# Patient Record
Sex: Male | Born: 1960 | Race: White | Hispanic: No | Marital: Married | Smoking: Former smoker
Health system: Southern US, Community
[De-identification: ages and names within clinical notes are randomized; demographics above are authoritative.]

## PROBLEM LIST (undated history)

## (undated) DIAGNOSIS — Z8719 Personal history of other diseases of the digestive system: Secondary | ICD-10-CM

## (undated) DIAGNOSIS — K219 Gastro-esophageal reflux disease without esophagitis: Secondary | ICD-10-CM

## (undated) DIAGNOSIS — K420 Umbilical hernia with obstruction, without gangrene: Secondary | ICD-10-CM

## (undated) DIAGNOSIS — Z87442 Personal history of urinary calculi: Secondary | ICD-10-CM

## (undated) DIAGNOSIS — I700 Atherosclerosis of aorta: Secondary | ICD-10-CM

## (undated) DIAGNOSIS — M1990 Unspecified osteoarthritis, unspecified site: Secondary | ICD-10-CM

## (undated) DIAGNOSIS — K5790 Diverticulosis of intestine, part unspecified, without perforation or abscess without bleeding: Secondary | ICD-10-CM

## (undated) HISTORY — PX: COLONOSCOPY W/ POLYPECTOMY: SHX1380

## (undated) HISTORY — PX: EXTRACORPOREAL SHOCK WAVE LITHOTRIPSY: SHX1557

## (undated) HISTORY — PX: HERNIA REPAIR: SHX51

---

## 1993-09-20 HISTORY — PX: THYROIDECTOMY, PARTIAL: SHX18

## 2004-08-19 ENCOUNTER — Emergency Department: Payer: Self-pay | Admitting: Emergency Medicine

## 2004-10-27 ENCOUNTER — Ambulatory Visit: Payer: Self-pay | Admitting: Specialist

## 2005-11-13 IMAGING — CT CT ABDOMEN AND PELVIS WITHOUT AND WITH CONTRAST
2 of 7 series · 14 of 32 positions shown, 19 images · non-contrast
Comparison: none

REASON FOR EXAM: flank pain
COMMENTS:

[Series 3: without inspace · axial · non-contrast · 0.69mm/px · z∈[-527,-185]mm · 7 of 457 slices shown, 12 images]
[im 58/457  soft-tissue]
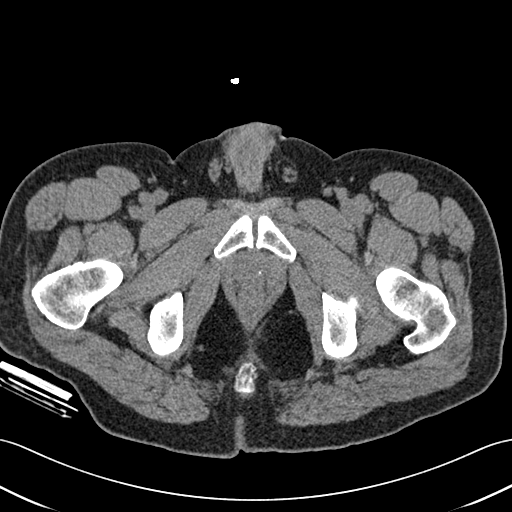
[im 58/457  bone]
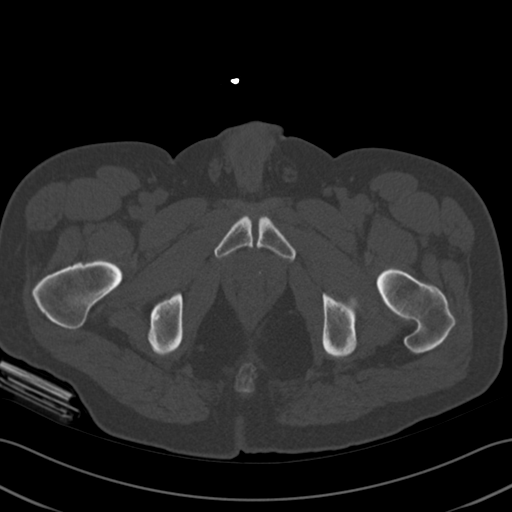
[im 115/457  soft-tissue]
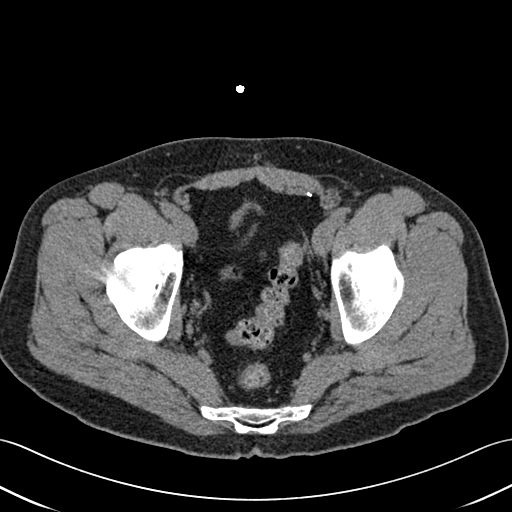
[im 172/457  soft-tissue]
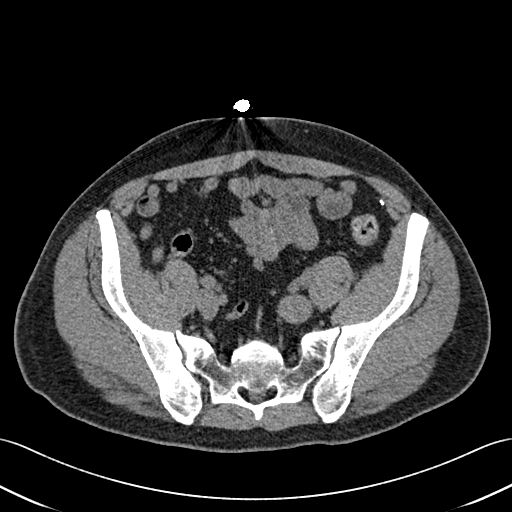
[im 229/457  soft-tissue]
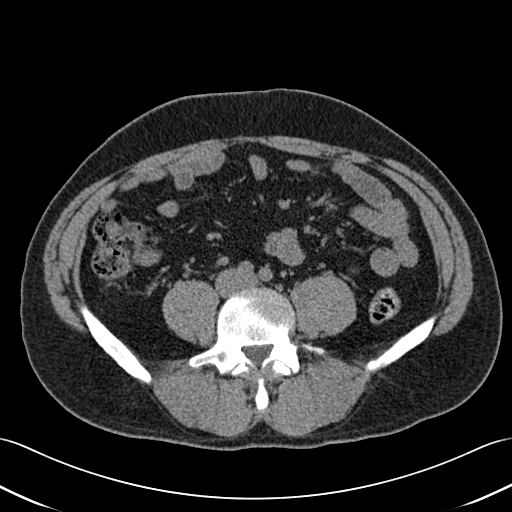
[im 229/457  lung]
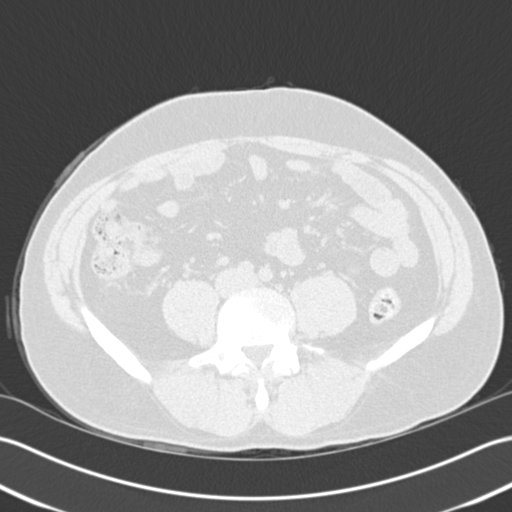
[im 286/457  soft-tissue]
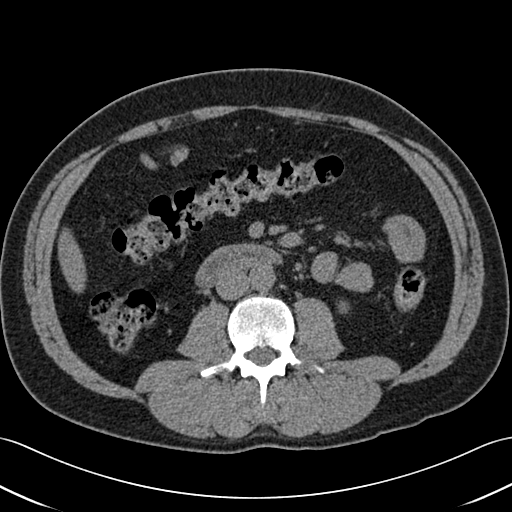
[im 286/457  lung]
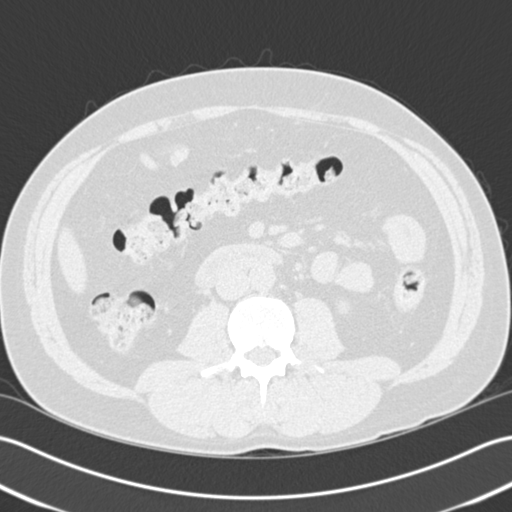
[im 343/457  soft-tissue]
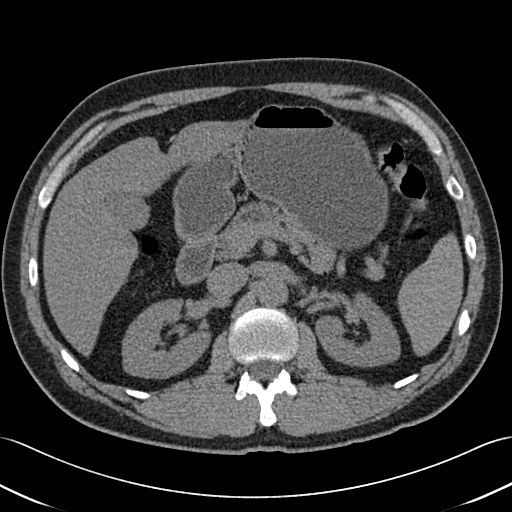
[im 343/457  lung]
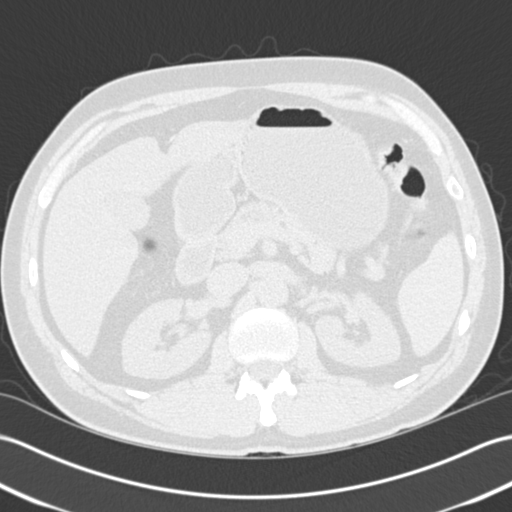
[im 400/457  soft-tissue]
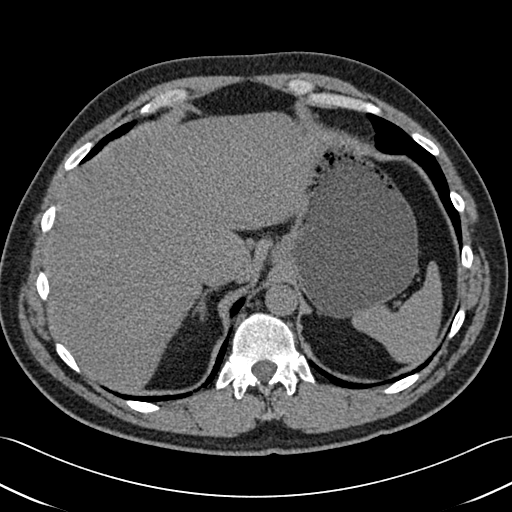
[im 400/457  lung]
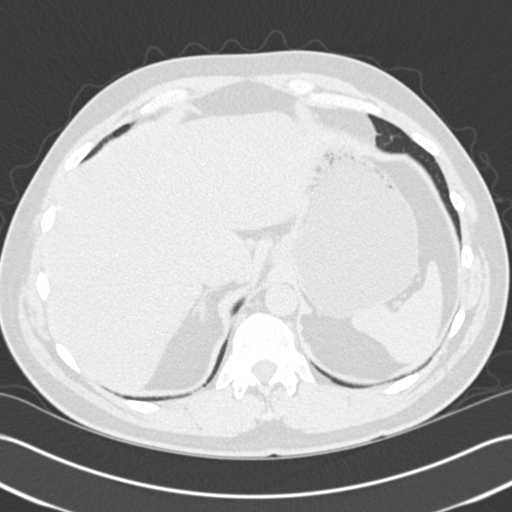

[Series 5: with inspace · axial · 0.69mm/px · z∈[-527,-185]mm · 7 of 457 slices shown]
[im 58/457  soft-tissue]
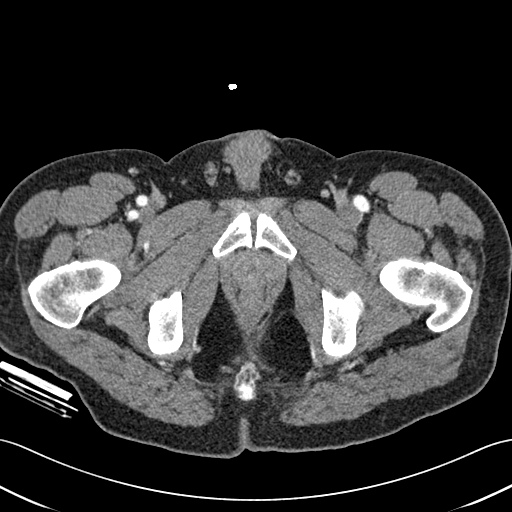
[im 115/457  soft-tissue]
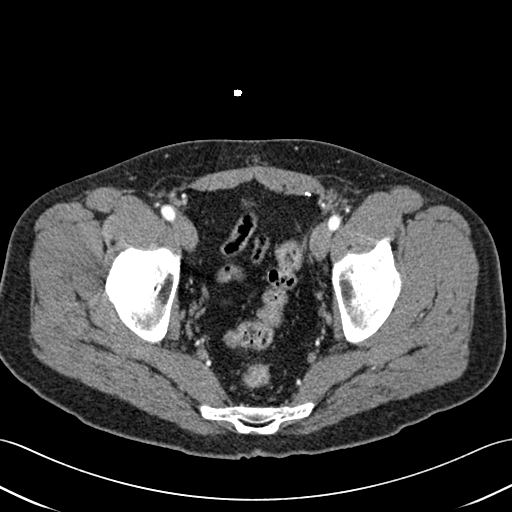
[im 172/457  soft-tissue]
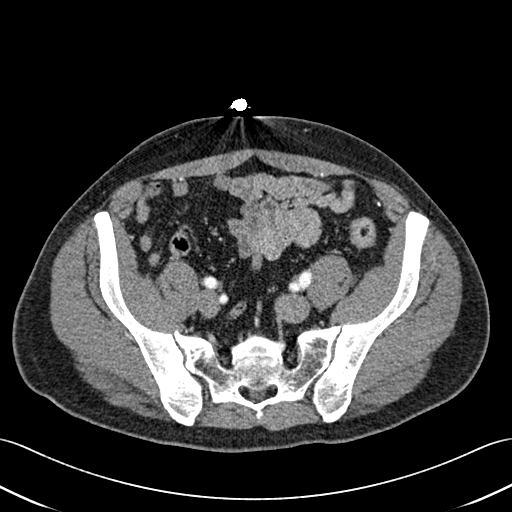
[im 229/457  soft-tissue]
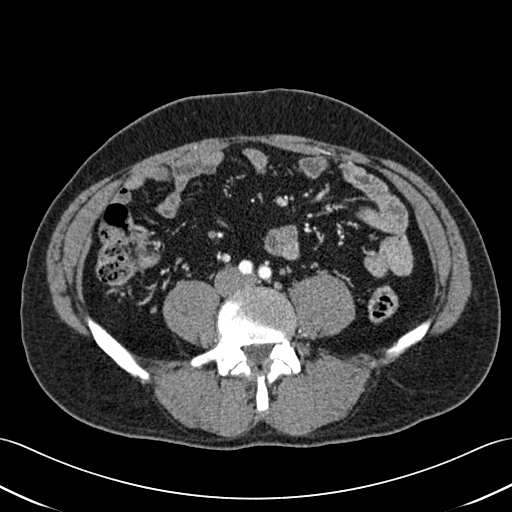
[im 286/457  soft-tissue]
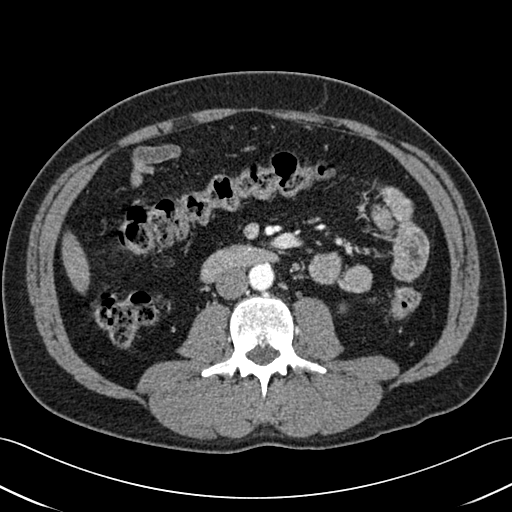
[im 343/457  soft-tissue]
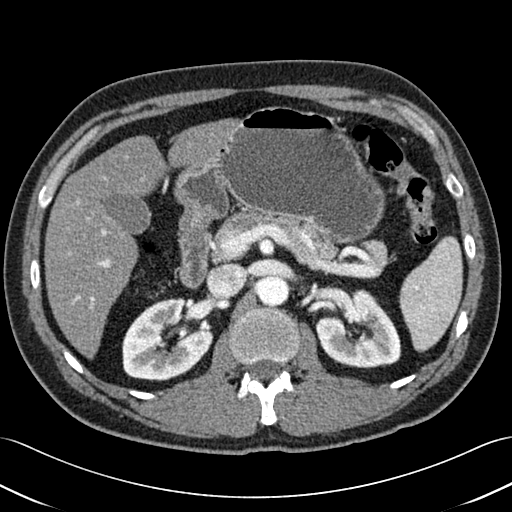
[im 400/457  soft-tissue]
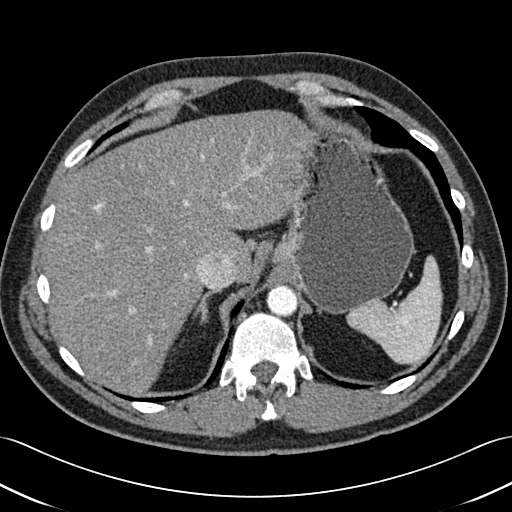

[14 of 32 positions shown; findings below may reference images not displayed]

PROCEDURE:     CT  - CT ABDOMEN / PELVIS  W/WO  - October 27, 2004  [DATE]

RESULT:     8.0 mm helical cuts were performed through the abdomen and
pelvis before and after administration of 100 cc's of Asovue-HFK contrast.

Comparison is made to a previous examination of 08/19/2004 which showed a
stone in the distal LEFT ureter.

Today's examination shows the lung bases to be clear. The solid organs,
aside from the kidneys, are unremarkable. The RIGHT kidney shows small,
to 2.0 mm calcifications over the upper and lower poles but these
calcifications are located in the corticomedullary junction and are not
causing obvious obstruction. Neither kidney shows evidence of hydronephrosis
or hydroureter. No perinephric stranding is identified. After contrast was
administered, there is normal enhancement of the solid organs of the
abdomen. There is some diffuse fatty infiltration of the liver. Again, there
is no evidence of obstructive uropathy. No free intraperitoneal fluid, air
or adenopathy is noted. Cuts through the pelvis show no evidence of
diverticular disease. The bladder distends normally without evidence of
filling defect or wall thickening and no distal ureteral calculus is
identified on today's study. The bony structures are normal.
IMPRESSION: 1.     Small, 1.0 to 2.0 mm calcifications are located in the
corticomedullary junction of the upper and lower pole of the RIGHT kidney
but they are not causing obstruction. There is no obstructive uropathy
identified today. No evidence of perinephric stranding. The previous study
of 08/19/2004 showed a small calcification in the distal LEFT ureter which
has resolved.
2.     No suspicious solid organ abnormality, free intraperitoneal fluid,
air or adenopathy.

## 2016-08-20 DIAGNOSIS — M1990 Unspecified osteoarthritis, unspecified site: Secondary | ICD-10-CM

## 2016-08-20 HISTORY — DX: Unspecified osteoarthritis, unspecified site: M19.90

## 2016-11-08 ENCOUNTER — Other Ambulatory Visit: Payer: Self-pay | Admitting: Otolaryngology

## 2016-11-08 DIAGNOSIS — E890 Postprocedural hypothyroidism: Secondary | ICD-10-CM

## 2016-11-15 ENCOUNTER — Ambulatory Visit
Admission: RE | Admit: 2016-11-15 | Discharge: 2016-11-15 | Disposition: A | Discharge status: Home / Self Care | Payer: BLUE CROSS/BLUE SHIELD | Source: Ambulatory Visit | Attending: Otolaryngology | Admitting: Otolaryngology

## 2016-11-15 DIAGNOSIS — E890 Postprocedural hypothyroidism: Secondary | ICD-10-CM | POA: Insufficient documentation

## 2016-11-15 DIAGNOSIS — E042 Nontoxic multinodular goiter: Secondary | ICD-10-CM | POA: Insufficient documentation

## 2017-05-03 ENCOUNTER — Encounter
Admission: RE | Admit: 2017-05-03 | Discharge: 2017-05-03 | Disposition: A | Discharge status: Home / Self Care | Payer: BLUE CROSS/BLUE SHIELD | Source: Ambulatory Visit | Attending: Otolaryngology | Admitting: Otolaryngology

## 2017-05-03 DIAGNOSIS — Z01812 Encounter for preprocedural laboratory examination: Secondary | ICD-10-CM | POA: Diagnosis present

## 2017-05-03 HISTORY — DX: Gastro-esophageal reflux disease without esophagitis: K21.9

## 2017-05-03 HISTORY — DX: Personal history of urinary calculi: Z87.442

## 2017-05-03 HISTORY — DX: Unspecified osteoarthritis, unspecified site: M19.90

## 2017-05-03 LAB — CBC
HEMATOCRIT: 43.4 % (ref 40.0–52.0)
HEMOGLOBIN: 14.9 g/dL (ref 13.0–18.0)
MCH: 33.1 pg (ref 26.0–34.0)
MCHC: 34.3 g/dL (ref 32.0–36.0)
MCV: 96.5 fL (ref 80.0–100.0)
Platelets: 213 10*3/uL (ref 150–440)
RBC: 4.49 MIL/uL (ref 4.40–5.90)
RDW: 12.9 % (ref 11.5–14.5)
WBC: 6 10*3/uL (ref 3.8–10.6)

## 2017-05-03 LAB — BASIC METABOLIC PANEL
ANION GAP: 7 (ref 5–15)
BUN: 17 mg/dL (ref 6–20)
CHLORIDE: 107 mmol/L (ref 101–111)
CO2: 27 mmol/L (ref 22–32)
CREATININE: 0.86 mg/dL (ref 0.61–1.24)
Calcium: 9.6 mg/dL (ref 8.9–10.3)
GFR calc Af Amer: >60 mL/min (ref >60)
GFR calc non Af Amer: >60 mL/min (ref >60)
Glucose, Bld: 126 mg/dL — ABNORMAL HIGH (ref 65–99)
Potassium: 3.9 mmol/L (ref 3.5–5.1)
Sodium: 141 mmol/L (ref 135–145)

## 2017-05-03 NOTE — Patient Instructions (Signed)
  Your procedure is scheduled WU:[id number]:Monday May 09, 2017. Report to Same Day Surgery. To find out your arrival time please call [phone] between 1PM - 3PM on Friday May 06, 2017 .  Remember: Instructions that are not followed completely may result in serious medical risk, up to and including death, or upon the discretion of your surgeon and anesthesiologist your surgery may need to be rescheduled.    _x___ 1. Do not eat food or drink liquids after midnight. No gum chewing or hard candies.     ____ 2. No Alcohol for 24 hours before or after surgery.   ____ 3. Bring all medications with you on the day of surgery if instructed.    __x__ 4. Notify your doctor if there is any change in your medical condition     (cold, fever, infections).    _____ 5. No smoking 24 hours prior to surgery.     Do not wear jewelry, make-up, hairpins, clips or nail polish.  Do not wear lotions, powders, or perfumes.   Do not shave 48 hours prior to surgery. Men may shave face and neck.  Do not bring valuables to the hospital.    [facility] any belongings or valuables.               Contacts, dentures or bridgework may not be worn into surgery.  Leave your suitcase in the car. After surgery it may be brought to your room.  For patients admitted to the hospital, discharge time is determined by your treatment team.   Patients discharged the day of surgery will not be allowed to drive home.    Please read over the following fact sheets that you were given:   [facility] Preparing for [facility]  _x___ Take these medicines the morning of surgery with A SIP OF WATER:    1. levothyroxine (SYNTHROID, LEVOTHROID)  2. omeprazole (PRILOSEC) take 1 dose at bedtime the night prior to surgery and the morning of surgery.   ____ Fleet Enema (as directed)   __x__ Use CHG Soap as directed on instruction sheet  ____ Use inhalers on the day of surgery and bring to hospital day of  surgery  ____ Stop metformin 2 days prior to surgery    ____ Take 1/2 of usual insulin dose the night before surgery and none on the morning of surgery.   ____ Stop Coumadin/Plavix/aspirin on does not apply.  _x___ Stop Anti-inflammatories such as Advil, Aleve, Ibuprofen, Mobic, Motrin, Naproxen, Naprosyn, Goodies powders or  aspirin products. OK to take Tylenol.   ____ Stop supplements until after surgery.    ____ Bring C-Pap to the hospital.

## 2017-05-09 ENCOUNTER — Observation Stay
Admission: RE | Admit: 2017-05-09 | Discharge: 2017-05-10 | Disposition: A | Discharge status: Home / Self Care | Payer: BLUE CROSS/BLUE SHIELD | Source: Ambulatory Visit | Attending: Otolaryngology | Admitting: Otolaryngology

## 2017-05-09 ENCOUNTER — Encounter: Payer: Self-pay | Admitting: *Deleted

## 2017-05-09 ENCOUNTER — Ambulatory Visit: Payer: BLUE CROSS/BLUE SHIELD | Admitting: Anesthesiology

## 2017-05-09 ENCOUNTER — Encounter
Admission: RE | Disposition: A | Discharge status: Home / Self Care | Payer: Self-pay | Source: Ambulatory Visit | Attending: Otolaryngology

## 2017-05-09 ENCOUNTER — Other Ambulatory Visit: Payer: BLUE CROSS/BLUE SHIELD

## 2017-05-09 DIAGNOSIS — Z79899 Other long term (current) drug therapy: Secondary | ICD-10-CM | POA: Diagnosis not present

## 2017-05-09 DIAGNOSIS — Z85850 Personal history of malignant neoplasm of thyroid: Secondary | ICD-10-CM | POA: Insufficient documentation

## 2017-05-09 DIAGNOSIS — Z9889 Other specified postprocedural states: Secondary | ICD-10-CM

## 2017-05-09 DIAGNOSIS — E041 Nontoxic single thyroid nodule: Principal | ICD-10-CM | POA: Insufficient documentation

## 2017-05-09 DIAGNOSIS — E890 Postprocedural hypothyroidism: Secondary | ICD-10-CM

## 2017-05-09 HISTORY — PX: THYROIDECTOMY: SHX17

## 2017-05-09 SURGERY — THYROIDECTOMY
Anesthesia: General | Wound class: Clean

## 2017-05-09 MED ORDER — ACETAMINOPHEN 650 MG RE SUPP: 650.0000 mg | Freq: Four times a day (QID) | RECTAL | Status: DC | PRN

## 2017-05-09 MED ORDER — LIDOCAINE-EPINEPHRINE (PF) 1 %-1:200000 IJ SOLN
INTRAMUSCULAR | Status: AC
  Filled 2017-05-09: qty 30

## 2017-05-09 MED ORDER — HYDROCODONE-ACETAMINOPHEN 5-325 MG PO TABS
1.0000 | ORAL_TABLET | ORAL | Status: DC | PRN
  Administered 2017-05-09: 2 via ORAL
  Filled 2017-05-09: qty 2

## 2017-05-09 MED ORDER — ONDANSETRON HCL 4 MG/2ML IJ SOLN: 4.0000 mg | Freq: Once | INTRAMUSCULAR | Status: DC | PRN

## 2017-05-09 MED ORDER — DEXAMETHASONE SODIUM PHOSPHATE 10 MG/ML IJ SOLN
INTRAMUSCULAR | Status: DC | PRN
  Administered 2017-05-09: 10 mg via INTRAVENOUS

## 2017-05-09 MED ORDER — GLYCOPYRROLATE 0.2 MG/ML IJ SOLN
INTRAMUSCULAR | Status: AC
  Filled 2017-05-09: qty 1

## 2017-05-09 MED ORDER — LACTATED RINGERS IV SOLN
INTRAVENOUS | Status: DC
  Administered 2017-05-09: 06:00:00 via INTRAVENOUS

## 2017-05-09 MED ORDER — MAGNESIUM HYDROXIDE 400 MG/5ML PO SUSP
30.0000 mL | Freq: Every day | ORAL | Status: DC | PRN
  Filled 2017-05-09: qty 30

## 2017-05-09 MED ORDER — PROPOFOL 10 MG/ML IV BOLUS
INTRAVENOUS | Status: AC
  Filled 2017-05-09: qty 40

## 2017-05-09 MED ORDER — LIDOCAINE HCL (PF) 2 % IJ SOLN
INTRAMUSCULAR | Status: AC
  Filled 2017-05-09: qty 2

## 2017-05-09 MED ORDER — MIDAZOLAM HCL 2 MG/2ML IJ SOLN
INTRAMUSCULAR | Status: DC | PRN
  Administered 2017-05-09: 2 mg via INTRAVENOUS

## 2017-05-09 MED ORDER — PHENYLEPHRINE HCL 10 MG/ML IJ SOLN
INTRAMUSCULAR | Status: AC
  Filled 2017-05-09: qty 1

## 2017-05-09 MED ORDER — FENTANYL CITRATE (PF) 100 MCG/2ML IJ SOLN
INTRAMUSCULAR | Status: DC | PRN
  Administered 2017-05-09 (×2): 100 ug via INTRAVENOUS

## 2017-05-09 MED ORDER — PANTOPRAZOLE SODIUM 40 MG PO TBEC
40.0000 mg | DELAYED_RELEASE_TABLET | Freq: Every day | ORAL | Status: DC
  Administered 2017-05-09: 40 mg via ORAL
  Filled 2017-05-09: qty 1

## 2017-05-09 MED ORDER — MIDAZOLAM HCL 2 MG/2ML IJ SOLN
INTRAMUSCULAR | Status: AC
  Filled 2017-05-09: qty 2

## 2017-05-09 MED ORDER — ONDANSETRON 4 MG PO TBDP: 4.0000 mg | ORAL_TABLET | Freq: Four times a day (QID) | ORAL | Status: DC | PRN

## 2017-05-09 MED ORDER — DEXTROSE-NACL 5-0.45 % IV SOLN
INTRAVENOUS | Status: DC
  Administered 2017-05-09 (×2): via INTRAVENOUS

## 2017-05-09 MED ORDER — ONDANSETRON HCL 4 MG/2ML IJ SOLN
INTRAMUSCULAR | Status: DC | PRN
  Administered 2017-05-09: 4 mg via INTRAVENOUS

## 2017-05-09 MED ORDER — LIDOCAINE HCL (CARDIAC) 20 MG/ML IV SOLN
INTRAVENOUS | Status: DC | PRN
  Administered 2017-05-09: 100 mg via INTRAVENOUS

## 2017-05-09 MED ORDER — CALCIUM CARBONATE-VITAMIN D 500-200 MG-UNIT PO TABS
1.0000 | ORAL_TABLET | Freq: Every day | ORAL | Status: DC
  Administered 2017-05-09: 12:00:00 1 via ORAL
  Filled 2017-05-09: qty 1

## 2017-05-09 MED ORDER — EPHEDRINE SULFATE 50 MG/ML IJ SOLN
INTRAMUSCULAR | Status: DC | PRN
  Administered 2017-05-09: 10 mg via INTRAVENOUS

## 2017-05-09 MED ORDER — ACETAMINOPHEN 10 MG/ML IV SOLN
INTRAVENOUS | Status: AC
  Filled 2017-05-09: qty 100

## 2017-05-09 MED ORDER — BACITRACIN 500 UNIT/GM EX OINT
TOPICAL_OINTMENT | CUTANEOUS | Status: DC | PRN
  Administered 2017-05-09: 1 via TOPICAL

## 2017-05-09 MED ORDER — SUCCINYLCHOLINE CHLORIDE 20 MG/ML IJ SOLN
INTRAMUSCULAR | Status: DC | PRN
  Administered 2017-05-09: 100 mg via INTRAVENOUS

## 2017-05-09 MED ORDER — FENTANYL CITRATE (PF) 100 MCG/2ML IJ SOLN
INTRAMUSCULAR | Status: AC
  Filled 2017-05-09: qty 2

## 2017-05-09 MED ORDER — LIDOCAINE-EPINEPHRINE (PF) 1 %-1:200000 IJ SOLN
INTRAMUSCULAR | Status: DC | PRN
  Administered 2017-05-09: 6 mL

## 2017-05-09 MED ORDER — PROPOFOL 10 MG/ML IV BOLUS
INTRAVENOUS | Status: DC | PRN
  Administered 2017-05-09: 200 mg via INTRAVENOUS

## 2017-05-09 MED ORDER — SUCCINYLCHOLINE CHLORIDE 20 MG/ML IJ SOLN
INTRAMUSCULAR | Status: AC
  Filled 2017-05-09: qty 1

## 2017-05-09 MED ORDER — ACETAMINOPHEN 325 MG PO TABS
650.0000 mg | ORAL_TABLET | ORAL | Status: DC | PRN
  Administered 2017-05-09 – 2017-05-10 (×3): 650 mg via ORAL
  Filled 2017-05-09 (×3): qty 2

## 2017-05-09 MED ORDER — BISACODYL 5 MG PO TBEC: 5.0000 mg | DELAYED_RELEASE_TABLET | Freq: Every day | ORAL | Status: DC | PRN

## 2017-05-09 MED ORDER — MORPHINE SULFATE (PF) 4 MG/ML IV SOLN: 3.0000 mg | INTRAVENOUS | Status: DC | PRN

## 2017-05-09 MED ORDER — SODIUM CHLORIDE FLUSH 0.9 % IV SOLN
INTRAVENOUS | Status: AC
  Filled 2017-05-09: qty 10

## 2017-05-09 MED ORDER — FLEET ENEMA 7-19 GM/118ML RE ENEM: 1.0000 | ENEMA | Freq: Once | RECTAL | Status: DC | PRN

## 2017-05-09 MED ORDER — BACITRACIN ZINC 500 UNIT/GM EX OINT
TOPICAL_OINTMENT | CUTANEOUS | Status: AC
  Filled 2017-05-09: qty 28.35

## 2017-05-09 MED ORDER — FENTANYL CITRATE (PF) 100 MCG/2ML IJ SOLN: 25.0000 ug | INTRAMUSCULAR | Status: DC | PRN

## 2017-05-09 MED ORDER — ACETAMINOPHEN 10 MG/ML IV SOLN
INTRAVENOUS | Status: DC | PRN
  Administered 2017-05-09: 1000 mg via INTRAVENOUS

## 2017-05-09 MED ORDER — ONDANSETRON HCL 4 MG/2ML IJ SOLN
INTRAMUSCULAR | Status: AC
  Filled 2017-05-09: qty 2

## 2017-05-09 MED ORDER — ONDANSETRON HCL 4 MG/2ML IJ SOLN: 4.0000 mg | Freq: Four times a day (QID) | INTRAMUSCULAR | Status: DC | PRN

## 2017-05-09 MED ORDER — DEXAMETHASONE SODIUM PHOSPHATE 10 MG/ML IJ SOLN
INTRAMUSCULAR | Status: AC
  Filled 2017-05-09: qty 1

## 2017-05-09 MED ORDER — EPHEDRINE SULFATE 50 MG/ML IJ SOLN
INTRAMUSCULAR | Status: AC
  Filled 2017-05-09: qty 1

## 2017-05-09 SURGICAL SUPPLY — 39 items
BLADE SURG 15 STRL LF DISP TIS (BLADE) ×1 IMPLANT
BLADE SURG 15 STRL SS (BLADE) ×2
CANISTER SUCT 1200ML W/VALVE (MISCELLANEOUS) ×3 IMPLANT
CORD BIP STRL DISP 12FT (MISCELLANEOUS) ×3 IMPLANT
DRAIN TLS ROUND 10FR (DRAIN) IMPLANT
DRAPE MAG INST 16X20 L/F (DRAPES) ×3 IMPLANT
DRSG TEGADERM 2-3/8X2-3/4 SM (GAUZE/BANDAGES/DRESSINGS) ×3 IMPLANT
DRSG TEGADERM 4X4.75 (GAUZE/BANDAGES/DRESSINGS) ×3 IMPLANT
DRSG TELFA 3X8 NADH (GAUZE/BANDAGES/DRESSINGS) ×3 IMPLANT
ELECT LARYNGEAL 6/7 (MISCELLANEOUS) ×3
ELECT LARYNGEAL 8/9 (MISCELLANEOUS)
ELECT REM PT RETURN 9FT ADLT (ELECTROSURGICAL) ×3
ELECTRODE LARYNGEAL 6/7 (MISCELLANEOUS) ×1 IMPLANT
ELECTRODE LARYNGEAL 8/9 (MISCELLANEOUS) IMPLANT
ELECTRODE REM PT RTRN 9FT ADLT (ELECTROSURGICAL) ×1 IMPLANT
FORCEPS JEWEL BIP 4-3/4 STR (INSTRUMENTS) ×3 IMPLANT
GLOVE BIO SURGEON STRL SZ7.5 (GLOVE) ×6 IMPLANT
GLOVE BIOGEL PI IND STRL 6.5 (GLOVE) ×1 IMPLANT
GLOVE BIOGEL PI INDICATOR 6.5 (GLOVE) ×2
GLOVE PROTEXIS LATEX SZ 7.5 (GLOVE) ×6 IMPLANT
GOWN STRL REUS W/ TWL LRG LVL3 (GOWN DISPOSABLE) ×3 IMPLANT
GOWN STRL REUS W/TWL LRG LVL3 (GOWN DISPOSABLE) ×6
HEMOSTAT SURGICEL 2X3 (HEMOSTASIS) ×3 IMPLANT
HOOK STAY BLUNT/RETRACTOR 5M (MISCELLANEOUS) ×3 IMPLANT
LABEL OR SOLS (LABEL) ×3 IMPLANT
NS IRRIG 500ML POUR BTL (IV SOLUTION) ×3 IMPLANT
PACK HEAD/NECK (MISCELLANEOUS) ×3 IMPLANT
PROBE NEUROSIGN BIPOL (MISCELLANEOUS) ×1 IMPLANT
PROBE NEUROSIGN BIPOLAR (MISCELLANEOUS) ×2
SHEARS HARMONIC 9CM CVD (BLADE) ×3 IMPLANT
SPONGE KITTNER 5P (MISCELLANEOUS) ×3 IMPLANT
SPONGE XRAY 4X4 16PLY STRL (MISCELLANEOUS) ×3 IMPLANT
STRAP SAFETY BODY (MISCELLANEOUS) ×3 IMPLANT
SUT ETHILON 6 0 9-3 1X18 BLK (SUTURE) ×3 IMPLANT
SUT PROLENE 6 0 P 1 18 (SUTURE) ×3 IMPLANT
SUT SILK 2 0 (SUTURE) ×2
SUT SILK 2-0 18XBRD TIE 12 (SUTURE) ×1 IMPLANT
SUT VIC AB 4-0 RB1 18 (SUTURE) ×3 IMPLANT
SYSTEM CHEST DRAIN TLS 7FR (DRAIN) ×3 IMPLANT

## 2017-05-09 NOTE — Transfer of Care (Signed)
Immediate Anesthesia Transfer of Care Note  Patient: Gary Matthews  Procedure(s) Performed: Procedure(s): THYROIDECTOMY (COMPLETION OF) (N/A)  Patient Location: PACU  Anesthesia Type:General  Level of Consciousness: sedated  Airway & Oxygen Therapy: Patient Spontanous Breathing and Patient connected to face mask oxygen  Post-op Assessment: Report given to RN and Post -op Vital signs reviewed and stable  Post vital signs: Reviewed and stable  Last Vitals:  Vitals:   05/09/17 0613 05/09/17 0921  BP: 123/72 (!) 142/78  Pulse: 75 (!) 111  Resp: 16 14  Temp: (!) 36.3 C 36.6 C  SpO2: 98% 99%    Complications: No apparent anesthesia complications

## 2017-05-09 NOTE — Anesthesia Post-op Follow-up Note (Signed)
Anesthesia QCDR form completed.        

## 2017-05-09 NOTE — H&P (Signed)
H&P has been reviewed and the patient reevaluated, and no changes necessary. To be downloaded later.  

## 2017-05-09 NOTE — Progress Notes (Signed)
05/09/2017 6:30 PM  S: Patient is feeling well and has very little pain, just using Tylenol. He ate regular food for dinner. He is not having any hoarseness and feels well. O: AVSS. The wound is flat and the dressing is dry. His drain has some serosanguineous fluid in the drain tube, this will be changed. No cramping noted A: He is doing very well postop. P: We'll plan to remove the drain in the morning. We'll check the lab work for his calcium draw early in the morning to make sure he is okay. Will order protonix that he can use if he should feel like he has some heartburn.

## 2017-05-09 NOTE — Anesthesia Postprocedure Evaluation (Signed)
Anesthesia Post Note  Patient: Gary Matthews  Procedure(s) Performed: Procedure(s) (LRB): THYROIDECTOMY (COMPLETION OF) (N/A)  Patient location during evaluation: PACU Anesthesia Type: General Level of consciousness: awake and alert Pain management: pain level controlled Vital Signs Assessment: post-procedure vital signs reviewed and stable Respiratory status: spontaneous breathing, nonlabored ventilation, respiratory function stable and patient connected to nasal cannula oxygen Cardiovascular status: blood pressure returned to baseline and stable Postop Assessment: no signs of nausea or vomiting Anesthetic complications: no     Last Vitals:  Vitals:   05/09/17 1105 05/09/17 1204  BP: 127/77 126/72  Pulse: 88 82  Resp: 16 16  Temp: 36.9 C 36.5 C  SpO2: 95% 95%    Last Pain:  Vitals:   05/09/17 1204  TempSrc: Oral  PainSc:                  Lisvet Rasheed S

## 2017-05-09 NOTE — Op Note (Signed)
.05/09/2017  9:17 AM    Gary Matthews  [id number]   Pre-Op Dx:  History of thyroid carcinoma right thyroid, hot nodules left thyroid  Post-op Dx: Same  Proc: Completion thyroidectomy   Surg:  Cammy Copa    assistant: Marion Downer  Anes:  GOT  EBL:  30 mL  Comp:  None  Findings:  Gland was fairly scarred down small nodules. The recurrent laryngeal nerve was found and intact at the end of the case by stimulation. An upper parathyroid gland was found in left intact  Procedure: The patient was given general anesthesia by oral endotracheal intubation. The endotracheal tube was wrapped with the electrode for continuous monitoring of the recurrent laryngeal nerves. THis was placed under direct vision to make sure it was in contact with vocal cords. Patient then had a shoulder roll placed. He had a previous incision line from right thyroidectomy done over 25 years ago. He had microcarcinoma in the thyroid gland and a small nodule. The remaining gland was not removed at that time and he did not get radiation therapy but only suppression. Now he has to hot nodules on his left side so completion thyroidectomy is done.  His neck is marked with marking pen and then 6 mL of 1% Xylocaine with epi 1:200,000 was used for infiltration in the anterior neck skin. He was prepped and draped sterile fashion. Incision was made around the previous scar line to remove the skin scar. The subcutaneous was entered and a superior and inferiorly based flap of skin and platysma was elevated. The strap muscles were densely scarred in the midline and this was cut through to open up the deeper layers. The left gland was evident but was significantly scarred down to the overlying strap muscle from previous dissection here. This was slowly elevated while cauterizing the vessels attached from the thyroid to the strap muscles. Strap muscles were elevated the superior pole was freed up and the superior thyroid vessels were  cut across with the Harmonic for controlling bleeding here. The gland was then able to be delivered down. The lateral border of the thyroid gland was freed up from the underlying bed and rotated medially. Then a difficult time finding the recurrent laryngeal nerve which was buried under several vessels. The upper parathyroid gland was found about a third of the way down the gland and was overlying the recurrent laryngeal nerve as well. This was separated from the gland and left in its bed. An inferior parathyroid gland was not noted. With the gland rotated medially and the recurrent laryngeal nerve found, the remaining attachments of the thyroid to the trachea were freed up. It was densely adherent at [facility] ligament and this was cut across. This freed up the entire remaining gland and it was marked at its superior pole and sent for permanent section.  The bed was revisualized and wound irrigated copiously with saline. The nerve was restimulated and was working. The paratracheal bed was covered with Surgicel and then a to 7 [place] TLS drain was placed through separate inferior stab incision. The strap muscles were tacked together loosely with 1 suture and then the platysmal layer was closed using 40 Vicryls. The subcutaneous was then closed with 40 Vicryls as well and then the skin edges were held with a running locking 6-0 Prolene suture. The wound was covered with bacitracin, Telfa, and a Tegaderm. The drain was taped to the skin and placed to Vacutainer suction. The patient was awakened and taken to  the recovery room in satisfactory condition. There were no operative complications.  Dispo:   To PACU to then be placed in observation overnight  Plan:  Will keep the drain and overnight and check him this evening make sure he is doing well. We'll check his calcium level in the morning and plan discharge home tomorrow morning once his drain has been removed.  Lecil Tapp H  05/09/2017 9:17 AM

## 2017-05-09 NOTE — Anesthesia Preprocedure Evaluation (Signed)
Anesthesia Evaluation  Patient identified by MRN, date of birth, ID band Patient awake    Reviewed: Allergy & Precautions, NPO status , Patient's Chart, lab work & pertinent test results, reviewed documented beta blocker date and time   Airway Mallampati: III  TM Distance: >3 FB     Dental  (+) Chipped   Pulmonary former smoker,           Cardiovascular      Neuro/Psych    GI/Hepatic GERD  Controlled,  Endo/Other    Renal/GU      Musculoskeletal  (+) Arthritis ,   Abdominal   Peds  Hematology   Anesthesia Other Findings   Reproductive/Obstetrics                             Anesthesia Physical Anesthesia Plan  ASA: III  Anesthesia Plan: General   Post-op Pain Management:    Induction: Intravenous  PONV Risk Score and Plan:   Airway Management Planned: Oral ETT  Additional Equipment:   Intra-op Plan:   Post-operative Plan:   Informed Consent: I have reviewed the patients History and Physical, chart, labs and discussed the procedure including the risks, benefits and alternatives for the proposed anesthesia with the patient or authorized representative who has indicated his/her understanding and acceptance.     Plan Discussed with: CRNA  Anesthesia Plan Comments:         Anesthesia Quick Evaluation

## 2017-05-09 NOTE — Progress Notes (Signed)
15 minute call to floor. 

## 2017-05-09 NOTE — Anesthesia Procedure Notes (Signed)
Procedure Name: Intubation Date/Time: 05/09/2017 7:26 AM Performed by: Doreen Salvage Pre-anesthesia Checklist: Patient identified, Patient being monitored, Timeout performed, Emergency Drugs available and Suction available Patient Re-evaluated:Patient Re-evaluated prior to induction Oxygen Delivery Method: Circle system utilized Preoxygenation: Pre-oxygenation with 100% oxygen Induction Type: IV induction Ventilation: Mask ventilation without difficulty Laryngoscope Size: Mac, 3, McGraph and 4 Grade View: Grade I Tube type: Oral Tube size: 7.5 mm Number of attempts: 2 Airway Equipment and Method: Stylet Placement Confirmation: ETT inserted through vocal cords under direct vision,  positive ETCO2 and breath sounds checked- equal and bilateral Secured at: 22 cm Tube secured with: Tape Dental Injury: Teeth and Oropharynx as per pre-operative assessment

## 2017-05-09 NOTE — Progress Notes (Deleted)
SATURATION QUALIFICATIONS: (This note is used to comply with regulatory documentation for home oxygen)  Patient Saturations on Room Air at Rest = 97%  Patient Saturations on Room Air while Ambulating = 77%  Patient Saturations on Room Air Liters of oxygen while Ambulating = 95%  Please briefly explain why patient needs home oxygen:

## 2017-05-10 DIAGNOSIS — E041 Nontoxic single thyroid nodule: Secondary | ICD-10-CM | POA: Diagnosis not present

## 2017-05-10 LAB — CALCIUM: Calcium: 9.4 mg/dL (ref 8.9–10.3)

## 2017-05-10 LAB — SURGICAL PATHOLOGY

## 2017-05-10 NOTE — Progress Notes (Signed)
05/10/2017  9:23 AM  Theresia Majors to be D/C'd Home per MD order.  Discussed prescriptions and follow up appointments with the patient. Prescriptions given to patient, medication list explained in detail. Pt verbalized understanding.  Allergies as of 05/10/2017   No Known Allergies     Medication List    TAKE these medications   calcium-vitamin D 500-200 MG-UNIT tablet Commonly known as:  OSCAL WITH D Take 1 tablet by mouth daily.   levothyroxine 88 MCG tablet Commonly known as:  SYNTHROID, LEVOTHROID Take 88 mcg by mouth daily before breakfast.   omeprazole 20 MG capsule Commonly known as:  PRILOSEC Take 20 mg by mouth daily.       Vitals:   05/10/17 0427 05/10/17 0849  BP: 120/67 118/60  Pulse: 69 70  Resp: 18   Temp: 97.9 F (36.6 C) 98.1 F (36.7 C)  SpO2: 96% 96%    Skin clean, dry and intact without evidence of skin break down, no evidence of skin tears noted. IV catheter discontinued intact. Site without signs and symptoms of complications. Dressing and pressure applied. Pt denies pain at this time. No complaints noted.  An After Visit Summary was printed and given to the patient. Patient escorted via WC, and D/C home via private auto.  Bradly Chris

## 2017-05-10 NOTE — Discharge Summary (Signed)
Physician Discharge Summary  Patient ID: Gary Matthews MRN: [id number] DOB/AGE: 1960-09-22 56 y.o.  Admit date: 05/09/2017 Discharge date: 05/10/2017  Admission Diagnoses: history of previous right thyroid microcarcinoma removed 25 years ago.left thyroid hot nodules with suspicion of tumor.  Discharge Diagnoses: same Active Problems:   Status post complete thyroidectomy   Discharged Condition: good  Hospital Course: patient had completion thyroidectomy done yesterday and is feeling well. He is eating liquids and soft foods and pain is well controlled with mostly Tylenol and one pain pill last night before bedtime. His drain was mostly serosanguineous and was pulled this morning. His wound looks great. Is flat with a dry dressing. His calcium level was 9.4 and it would remain on some calcium supplementation. He will restart his levothyroxine at 0.088 mg per day as before. I will write a prescription of Norco 5/325 to be used when necessary. he will follow-up in the office in 1 week for suture removal.  Consults: None  Significant Diagnostic Studies: labs: calcium this morning was 9.4 and preoperatively was 9.6  Treatments: surgery as noted above  Discharge Exam: Blood pressure 120/67, pulse 69, temperature 97.9 F (36.6 C), temperature source Oral, resp. rate 18, height 5\' 10"  (1.778 m), weight 93.8 kg (206 lb 11.2 oz), SpO2 96 %. Incision/Wound:flat with no bruising and a dry dressing.  Disposition: Final discharge disposition not confirmed  Discharge Instructions    Call MD for:  hives    Complete by:  As directed    Call MD for:  persistant nausea and vomiting    Complete by:  As directed    Call MD for:  redness, tenderness, or signs of infection (pain, swelling, redness, odor or green/yellow discharge around incision site)    Complete by:  As directed    Call MD for:  temperature >100.4    Complete by:  As directed    Diet general    Complete by:  As directed    Increase  activity slowly    Complete by:  As directed      Allergies as of 05/10/2017   No Known Allergies     Medication List    TAKE these medications   calcium-vitamin D 500-200 MG-UNIT tablet Commonly known as:  OSCAL WITH D Take 1 tablet by mouth daily.   levothyroxine 88 MCG tablet Commonly known as:  SYNTHROID, LEVOTHROID Take 88 mcg by mouth daily before breakfast.   omeprazole 20 MG capsule Commonly known as:  PRILOSEC Take 20 mg by mouth daily.        Signed: Alejandrina Raimer H 05/10/2017, 7:36 AM

## 2017-11-21 ENCOUNTER — Encounter: Payer: Self-pay | Admitting: General Surgery

## 2023-10-29 ENCOUNTER — Emergency Department: Payer: BC Managed Care – PPO

## 2023-10-29 ENCOUNTER — Other Ambulatory Visit: Payer: Self-pay

## 2023-10-29 ENCOUNTER — Emergency Department
Admission: EM | Admit: 2023-10-29 | Discharge: 2023-10-29 | Disposition: A | Discharge status: Home / Self Care | Payer: BC Managed Care – PPO | Attending: Emergency Medicine | Admitting: Emergency Medicine

## 2023-10-29 DIAGNOSIS — R1032 Left lower quadrant pain: Secondary | ICD-10-CM | POA: Diagnosis present

## 2023-10-29 DIAGNOSIS — R109 Unspecified abdominal pain: Secondary | ICD-10-CM

## 2023-10-29 DIAGNOSIS — N201 Calculus of ureter: Secondary | ICD-10-CM | POA: Diagnosis not present

## 2023-10-29 LAB — URINALYSIS, ROUTINE W REFLEX MICROSCOPIC
Bacteria, UA: NONE SEEN
Bilirubin Urine: NEGATIVE
Glucose, UA: NEGATIVE mg/dL
Ketones, ur: NEGATIVE mg/dL
Leukocytes,Ua: NEGATIVE
Nitrite: NEGATIVE
Protein, ur: NEGATIVE mg/dL
Specific Gravity, Urine: 1.012 (ref 1.005–1.030)
Squamous Epithelial / HPF: 0 /[HPF] (ref 0–5)
pH: 5 (ref 5.0–8.0)

## 2023-10-29 LAB — BASIC METABOLIC PANEL
Anion gap: 7 (ref 5–15)
BUN: 15 mg/dL (ref 8–23)
CO2: 27 mmol/L (ref 22–32)
Calcium: 9.4 mg/dL (ref 8.9–10.3)
Chloride: 104 mmol/L (ref 98–111)
Creatinine, Ser: 1.05 mg/dL (ref 0.61–1.24)
GFR, Estimated: >60 mL/min (ref >60)
Glucose, Bld: 184 mg/dL — ABNORMAL HIGH (ref 70–99)
Potassium: 3.7 mmol/L (ref 3.5–5.1)
Sodium: 138 mmol/L (ref 135–145)

## 2023-10-29 LAB — CBC
HCT: 43.5 % (ref 39.0–52.0)
Hemoglobin: 15.1 g/dL (ref 13.0–17.0)
MCH: 33.9 pg (ref 26.0–34.0)
MCHC: 34.7 g/dL (ref 30.0–36.0)
MCV: 97.8 fL (ref 80.0–100.0)
Platelets: 219 10*3/uL (ref 150–400)
RBC: 4.45 MIL/uL (ref 4.22–5.81)
RDW: 12 % (ref 11.5–15.5)
WBC: 9.1 10*3/uL (ref 4.0–10.5)
nRBC: 0 % (ref 0.0–0.2)

## 2023-10-29 MED ORDER — KETOROLAC TROMETHAMINE 15 MG/ML IJ SOLN
15.0000 mg | Freq: Once | INTRAMUSCULAR | Status: AC
  Administered 2023-10-29: 15 mg via INTRAVENOUS
  Filled 2023-10-29: qty 1

## 2023-10-29 MED ORDER — IOHEXOL 300 MG/ML  SOLN
100.0000 mL | Freq: Once | INTRAMUSCULAR | Status: AC | PRN
  Administered 2023-10-29: 100 mL via INTRAVENOUS

## 2023-10-29 MED ORDER — OXYCODONE HCL 5 MG PO TABS: 5.0000 mg | ORAL_TABLET | Freq: Three times a day (TID) | ORAL | 0 refills | Status: DC | PRN

## 2023-10-29 MED ORDER — TAMSULOSIN HCL 0.4 MG PO CAPS: 0.4000 mg | ORAL_CAPSULE | Freq: Every day | ORAL | 0 refills | Status: AC

## 2023-10-29 NOTE — Discharge Instructions (Addendum)
 Take acetaminophen  650 mg and ibuprofen 400 mg every 6 hours for pain.  Take with food. Take oxycodone  for severe pain only. Take Flomax  daily as prescribed.   Call urologist Dr. Alvaro for an appointment  Thank you for choosing us  for your health care today!  Please see your primary doctor this week for a follow up appointment.   If you have any new, worsening, or unexpected symptoms call your doctor right away or come back to the emergency department for reevaluation.  It was my pleasure to care for you today.   Ginnie EDISON Cyrena, MD

## 2023-10-29 NOTE — ED Triage Notes (Signed)
 PT to ED POV, complaining of left side pain,  urine urgency, patient woke with this pain at around 0330,  last kidney stone was when he was 19

## 2023-10-29 NOTE — ED Provider Notes (Signed)
 [facility] Provider Note    Event Date/Time   First MD Initiated Contact with Patient 10/29/23 [phone]     (approximate)   History   Flank Pain   HPI  Ilhan Madan is a 63 y.o. male   Past medical history of remote history of kidney stones as a teenager, GERD and arthritis who presents to the emergency department with left-sided flank/lower abdominal pain that woke him from sleep tonight.  Radiates down towards the groin.  Sensation that he has to defecate but has not been able to.  Otherwise has been in his regular state of health, BMs normal, passing flatus, no urinary symptoms.  No fevers or chills.  No obvious inciting events like injuries or falls.  Independent Historian contributed to assessment above: His wife is at bedside to corroborate information past medical history as above    Physical Exam   Triage Vital Signs: ED Triage Vitals  Encounter Vitals Group     BP 10/29/23 0410 (!) 147/122     Systolic BP Percentile --      Diastolic BP Percentile --      Pulse Rate 10/29/23 0410 64     Resp 10/29/23 0410 18     Temp 10/29/23 0410 97.6 F (36.4 C)     Temp Source 10/29/23 0410 Oral     SpO2 10/29/23 0410 98 %     Weight 10/29/23 0410 225 lb (102.1 kg)     Height 10/29/23 0410 5' 10 (1.778 m)     Head Circumference --      Peak Flow --      Pain Score 10/29/23 0409 10     Pain Loc --      Pain Education --      Exclude from Growth Chart --     Most recent vital signs: Vitals:   10/29/23 0410  BP: (!) 147/122  Pulse: 64  Resp: 18  Temp: 97.6 F (36.4 C)  SpO2: 98%    General: Awake, no distress.  CV:  Good peripheral perfusion.  Resp:  Normal effort.  Abd:  No distention.  Other:  Mild left-sided CVA tenderness and left lower quadrant tenderness without rigidity or guarding.  Hypertensive otherwise vital signs normal.   ED Results / Procedures / Treatments   Labs (all labs ordered are listed, but only abnormal results  are displayed) Labs Reviewed  BASIC METABOLIC PANEL - Abnormal; Notable for the following components:      Result Value   Glucose, Bld 184 (*)    All other components within normal limits  URINALYSIS, ROUTINE W REFLEX MICROSCOPIC - Abnormal; Notable for the following components:   Color, Urine YELLOW (*)    APPearance CLEAR (*)    Hgb urine dipstick SMALL (*)    All other components within normal limits  CBC     I ordered and reviewed the above labs they are notable for cell counts electrolytes unremarkable.     RADIOLOGY I independently reviewed and interpreted CT of the abdomen pelvis to see a hyper density in the left lower quadrant suspicious for ureteral stone I also reviewed radiologist's formal read.   PROCEDURES:  Critical Care performed: No  Procedures   MEDICATIONS ORDERED IN ED: Medications  ketorolac  (TORADOL ) 15 MG/ML injection 15 mg (15 mg Intravenous Given 10/29/23 0505)  iohexol  (OMNIPAQUE ) 300 MG/ML solution 100 mL (100 mLs Intravenous Contrast Given 10/29/23 0451)   IMPRESSION / MDM / ASSESSMENT AND PLAN /  ED COURSE  I reviewed the triage vital signs and the nursing notes.                                Patient's presentation is most consistent with acute presentation with potential threat to life or bodily function.  Differential diagnosis includes, but is not limited to, renal colic, kidney stone, urinary tract infection, diverticulitis   The patient is on the cardiac monitor to evaluate for evidence of arrhythmia and/or significant heart rate changes.  MDM:    Left-sided abdominal/flank pain with suspicious for renal colic or kidney stone, versus diverticulitis.  Getting a CT scan of the abdomen pelvis with con IV contrast to further differentiate.  Labs unremarkable, pending urinalysis.  Toradol  given for pain control.  Symptomatology and exam would not be consistent with vascular emergency like dissection or aneurysm.  Disposition pending results  of imaging and reassessment.  --  Patient's pain well-controlled with medications.  5 mm stone noted at the UVJ on imaging.  Discharged with Flomax , pain medications and urology follow-up.         FINAL CLINICAL IMPRESSION(S) / ED DIAGNOSES   Final diagnoses:  LLQ pain  Left flank pain     Rx / DC Orders   ED Discharge Orders     None        Note:  This document was prepared using Dragon voice recognition software and may include unintentional dictation errors.    Cyrena Mylar, MD 10/29/23 [phone]

## 2023-11-16 ENCOUNTER — Ambulatory Visit
Admission: RE | Admit: 2023-11-16 | Discharge: 2023-11-16 | Disposition: A | Discharge status: Home / Self Care | Payer: BC Managed Care – PPO | Attending: Urology | Admitting: Urology

## 2023-11-16 ENCOUNTER — Encounter: Payer: Self-pay | Admitting: Urology

## 2023-11-16 ENCOUNTER — Ambulatory Visit (INDEPENDENT_AMBULATORY_CARE_PROVIDER_SITE_OTHER): Payer: BC Managed Care – PPO | Admitting: Urology

## 2023-11-16 ENCOUNTER — Ambulatory Visit
Admission: RE | Admit: 2023-11-16 | Discharge: 2023-11-16 | Disposition: A | Discharge status: Home / Self Care | Payer: BC Managed Care – PPO | Source: Ambulatory Visit | Attending: Urology | Admitting: Urology

## 2023-11-16 VITALS — BP 154/74 | Ht 70.0 in | Wt 218.0 lb

## 2023-11-16 DIAGNOSIS — E039 Hypothyroidism, unspecified: Secondary | ICD-10-CM | POA: Insufficient documentation

## 2023-11-16 DIAGNOSIS — N201 Calculus of ureter: Secondary | ICD-10-CM

## 2023-11-16 DIAGNOSIS — Z09 Encounter for follow-up examination after completed treatment for conditions other than malignant neoplasm: Secondary | ICD-10-CM

## 2023-11-16 DIAGNOSIS — E66811 Obesity, class 1: Secondary | ICD-10-CM | POA: Insufficient documentation

## 2023-11-16 DIAGNOSIS — Z87442 Personal history of urinary calculi: Secondary | ICD-10-CM | POA: Diagnosis not present

## 2023-11-16 HISTORY — DX: Hypothyroidism, unspecified: E03.9

## 2023-11-16 NOTE — Progress Notes (Unsigned)
   11/16/23 3:31 PM   Gary Matthews [birth year] [id number]  CC: Left ureteral stone  HPI: 63 year old male who presented to the ER on 10/29/2023 with left-sided flank pain and CT showed a 6 mm left distal ureteral stone with mild upstream hydronephrosis.  Urinalysis was benign and he was discharged with medical expulsive therapy.  He thinks he may have passed the stone, but he never saw a stone pass.  He does have some intermittent mild groin pain which he thinks could be related to a hernia.  He denies any urinary symptoms or gross hematuria.   PMH: Past Medical History:  Diagnosis Date   Arthritis 08/2016   left knee   GERD (gastroesophageal reflux disease)    History of kidney stones    Hypothyroidism 11/16/2023   2/2 Thyroidectomy    Surgical History: Past Surgical History:  Procedure Laterality Date   THYROIDECTOMY N/A 05/09/2017   Procedure: THYROIDECTOMY (COMPLETION OF);  Surgeon: Vernie Murders, MD;  Location: [facility] ORS;  Service: ENT;  Laterality: N/A;   THYROIDECTOMY, PARTIAL Left 1995    Social History:  reports that he quit smoking about 41 years ago. His smoking use included cigarettes. He has never used smokeless tobacco. He reports current alcohol use of about 4.0 standard drinks of alcohol per week. He reports that he does not use drugs.  Physical Exam: BP (!) 154/74   Ht 5\' 10"  (1.778 m)   Wt 218 lb (98.9 kg)   BMI 31.28 kg/m    Constitutional:  Alert and oriented, No acute distress. Cardiovascular: No clubbing, cyanosis, or edema. Respiratory: Normal respiratory effort, no increased work of breathing. GI: Abdomen is soft, nontender, nondistended, no abdominal masses   Laboratory Data: Reviewed, see HPI  Pertinent Imaging: I have personally viewed and interpreted the CT scan showing a 6 mm left distal ureteral stone.  KUB today with no definitive evidence of stone  Assessment & Plan:   63 yo M with 5mm left distal ureteral stone, symptoms have resolved  and he thinks he passed his stone. KUB without residual stone today, we reviewed limitations of KUB vs CT, and return precautions reviewed extensively including renal colic, groin pain, or urinary symptoms.  We discussed general stone prevention strategies including adequate hydration with goal of producing 2.5 L of urine daily, increasing citric acid intake, increasing calcium intake during high oxalate meals, minimizing animal protein, and decreasing salt intake. Information about dietary recommendations given today.   Follow up with urology as needed   Legrand Rams, MD 11/16/2023  [facility] [address], [address] [place], [place] [address] [phone]

## 2023-11-16 NOTE — Patient Instructions (Signed)
 Kidney Stones Kidney stones are rock-like masses that form inside of the kidneys. Kidneys are organs that make pee (urine). A kidney stone may move into other parts of the urinary tract, including: The tubes that connect the kidneys to the bladder (ureters). The bladder. The tube that carries urine out of the body (urethra). Kidney stones can cause very bad pain and can block the flow of pee. The stone usually leaves your body through your pee. A doctor may need to take out the stone. What are the causes? Kidney stones may be caused by: Too much calcium in the body. This may be caused by too much parathyroid hormone in the blood. Uric acid crystals in the bladder. The body makes uric acid when you eat certain foods. Narrowing of one or both of the ureters. A kidney blockage that you were born with. Past surgery on the kidney or the ureters. What increases the risk? You are more likely to develop this condition if: You have had a kidney stone in the past. Other people in your family have had kidney stones. You do not drink enough water. You eat a diet that is high in protein, salt (sodium), or sugar. You are very overweight (obese). What are the signs or symptoms? Symptoms of a kidney stone may include: Pain in the side of the belly, right below the ribs. Pain usually spreads to the groin. Needing to pee often or right away. Pain when peeing. Blood in your pee. Feeling like you may vomit (nauseous). Vomiting. Fever and chills. How is this treated? Treatment depends on the size, location, and makeup of the kidney stones. The stones will often pass out of the body when you pee. You may need to: Drink more fluid to help pass the stone. In some cases, you may be given fluids through an IV tube at the hospital. Take medicine for pain. Change your diet to help keep kidney stones from coming back. Sometimes, you may need: A procedure to break up kidney stones using a beam of light (laser)  or shock waves. Surgery to remove the kidney stones. Follow these instructions at home: Medicines Take over-the-counter and prescription medicines only as told by your doctor. Ask your doctor if the medicine prescribed to you requires you to avoid driving or using machinery. Eating and drinking Drink enough fluid to keep your pee pale yellow. You may be told to drink at least 8-10 glasses of water each day. This will help you pass the stone. If told by your doctor, change your diet. You may be told to: Limit how much salt you eat. Eat more fruits and vegetables. Limit how much meat, poultry, fish, and eggs you eat. Follow instructions from your doctor about what you may eat and drink. General instructions Collect pee samples as told by your doctor. You may need to collect a pee sample: 24 hours after a stone comes out. 8-12 weeks after a stone comes out, and every 6-12 months after that. Strain your pee every time you pee. Use the strainer that your doctor recommends. Do not throw out the stone. Keep it so that it can be tested by your doctor. Keep all follow-up visits. You may need X-rays and ultrasounds to make sure the stone has come out. How is this prevented? To prevent another kidney stone: Drink enough fluid to keep your pee pale yellow. This is the best way to prevent kidney stones. Eat healthy foods. Avoid certain foods as told by your doctor. You  may be told to eat less protein. Stay at a healthy weight. Where to find more information National Kidney Foundation (NKF): kidney.org [facility]): urologyhealth.org Contact a doctor if: You have pain that gets worse or does not get better with medicine. Get help right away if: You have a fever or chills. You get very bad pain. You get new pain in your belly. You faint. You cannot pee. This information is not intended to replace advice given to you by your health care provider. Make sure you discuss any  questions you have with your health care provider. Document Revised: 04/30/2022 Document Reviewed: 04/30/2022 Elsevier Patient Education  2024 [employer].

## 2023-11-18 ENCOUNTER — Telehealth: Payer: Self-pay | Admitting: Urology

## 2023-11-18 NOTE — Telephone Encounter (Signed)
 Patient called wanting to know the results of his KUB. Please advise patient.

## 2023-11-22 ENCOUNTER — Telehealth: Payer: Self-pay

## 2023-11-22 NOTE — Telephone Encounter (Signed)
-----   Message from Sondra Come sent at 11/17/2023  7:48 AM EST ----- Regarding: XRAY results Good news, I do not see a stone on his KUB, and he likely passed it. If he has worsening pain or urinary symptoms he should reach out to [place], otherwise can RTC prn  Legrand Rams, MD 11/17/2023

## 2023-11-22 NOTE — Telephone Encounter (Signed)
Called pt no answer. Left detailed message per DPR. Advised pt to call back for questions or concerns.  

## 2023-11-22 NOTE — Telephone Encounter (Signed)
 See previous telephone encounter.

## 2024-09-03 ENCOUNTER — Ambulatory Visit: Payer: Self-pay | Admitting: Surgery

## 2024-09-03 ENCOUNTER — Encounter: Payer: Self-pay | Admitting: Surgery

## 2024-09-03 ENCOUNTER — Telehealth: Payer: Self-pay | Admitting: Surgery

## 2024-09-03 VITALS — BP 155/81 | HR 80 | Ht 70.0 in | Wt 214.0 lb

## 2024-09-03 DIAGNOSIS — K420 Umbilical hernia with obstruction, without gangrene: Secondary | ICD-10-CM

## 2024-09-03 NOTE — Progress Notes (Signed)
 09/03/2024  Reason for Visit: Incarcerated umbilical hernia  History of Present Illness: Gary Matthews is a 63 y.o. male presenting for evaluation of an umbilical hernia.  The patient reports noticing this about a year ago while he was doing strenuous exercise.  Separately, he had a CT scan of the abdomen pelvis for left kidney stone on 10/29/2023 which confirms him having a 2 cm umbilical hernia.  The patient reports that the area becomes more tender when he is doing strenuous activity but does not feel that the hernia has been getting bigger in size.  He reports he gets little bit flatter when he lies down but otherwise is bulging.  Denies any pain as a day goes on and feels that the discomfort is more when he is doing strenuous activities.  Patient denies any fevers, chills, chest pain, shortness of breath, nausea, vomiting, constipation.  Given his symptoms, he would like to have this addressed before he gets worse.  Past Medical History: Past Medical History:  Diagnosis Date   Arthritis 08/2016   left knee   GERD (gastroesophageal reflux disease)    History of kidney stones    Hypothyroidism 11/16/2023   2/2 Thyroidectomy     Past Surgical History: Past Surgical History:  Procedure Laterality Date   THYROIDECTOMY N/A 05/09/2017   Procedure: THYROIDECTOMY (COMPLETION OF);  Surgeon: Juengel, Paul, MD;  Location: [facility] ORS;  Service: ENT;  Laterality: N/A;   THYROIDECTOMY, PARTIAL Left 1995    Home Medications: Prior to Admission medications  Medication Sig Start Date End Date Taking? Authorizing Provider  calcium -vitamin D  (OSCAL WITH D) 500-200 MG-UNIT tablet Take 1 tablet by mouth daily.     [provider]  levothyroxine (SYNTHROID) 112 MCG tablet Take 112 mcg by mouth daily. 09/26/23   [provider]  omeprazole (PRILOSEC) 20 MG capsule Take 20 mg by mouth daily.    [provider]    Allergies: Allergies[1]  Social History:  reports that he quit  smoking about 42 years ago. His smoking use included cigarettes. He has never used smokeless tobacco. He reports current alcohol use of about 4.0 standard drinks of alcohol per week. He reports that he does not use drugs.   Family History: History reviewed. No pertinent family history.  Review of Systems: Review of Systems  Constitutional:  Negative for chills and fever.  HENT:  Negative for hearing loss.   Respiratory:  Negative for shortness of breath.   Cardiovascular:  Negative for chest pain.  Gastrointestinal:  Positive for abdominal pain. Negative for nausea and vomiting.  Genitourinary:  Negative for dysuria.  Musculoskeletal:  Negative for myalgias.  Skin:  Negative for rash.  Neurological:  Negative for dizziness.  Psychiatric/Behavioral:  Negative for depression.     Physical Exam BP (!) 155/81   Pulse 80   Ht 5' 10 (1.778 m)   Wt 214 lb (97.1 kg)   SpO2 98%   BMI 30.71 kg/m  CONSTITUTIONAL: No acute distress, well-nourished HEENT:  Normocephalic, atraumatic, extraocular motion intact. NECK: Trachea is midline, and there is no jugular venous distension.  RESPIRATORY:  Lungs are clear, and breath sounds are equal bilaterally. Normal respiratory effort without pathologic use of accessory muscles. CARDIOVASCULAR: Heart is regular without murmurs, gallops, or rubs. GI: The abdomen is soft, nondistended, with mild tenderness to palpation at the umbilicus.  The patient has a small umbilical hernia which is not reducible with some tenderness when I was trying to push on it to try  to reduce.  However no evidence of strangulation.   MUSCULOSKELETAL:  Normal muscle strength and tone in all four extremities.  No peripheral edema or cyanosis. SKIN: Skin turgor is normal. There are no pathologic skin lesions.  NEUROLOGIC:  Motor and sensation is grossly normal.  Cranial nerves are grossly intact. PSYCH:  Alert and oriented to person, place and time. Affect is normal.  Laboratory  Analysis: Labs from 10/29/2023: Sodium 138, potassium 3.7, chloride 104, CO2 27, BUN 15, creatinine 1.05.  WBC 9.1, hemoglobin 15.1, hematocrit 43.5, platelets 219.  Imaging: CT abdomen/pelvis on 10/29/2023: FINDINGS: Lower chest: Dependent atelectasis.   Hepatobiliary: The liver shows diffusely decreased attenuation suggesting fat deposition. 19 mm water density lesion in the medial right liver (31/2) is compatible with a cyst. No followup imaging is recommended. There is no evidence for gallstones, gallbladder wall thickening, or pericholecystic fluid. No intrahepatic or extrahepatic biliary dilation.   Pancreas: No focal mass lesion. No dilatation of the main duct. No intraparenchymal cyst. No peripancreatic edema.   Spleen: No splenomegaly. No suspicious focal mass lesion.   Adrenals/Urinary Tract: No adrenal nodule or mass. 5 mm nonobstructing stone identified lower pole right kidney. Right ureter unremarkable. No stones are seen in the left kidney although there is mild left-sided hydroureteronephrosis and decreased perfusion to the left kidney. Left ureter is dilated down to the pelvis where a 5 x 5 x 6 mm distal left ureteral stone is identified just proximal to the UVJ (axial 84/2 and sagittal 60/6).   Stomach/Bowel: Small hiatal hernia. Stomach is unremarkable. No gastric wall thickening. No evidence of outlet obstruction. Duodenum is normally positioned as is the ligament of Treitz. No small bowel wall thickening. No small bowel dilatation. The terminal  ileum is normal. The appendix is normal. No gross colonic mass. No colonic wall thickening. Diverticular changes are noted in the left colon without evidence of diverticulitis.   Vascular/Lymphatic: There is mild atherosclerotic calcification of the abdominal aorta without aneurysm. There is no gastrohepatic or hepatoduodenal ligament lymphadenopathy. No retroperitoneal or mesenteric lymphadenopathy. No pelvic sidewall lymphadenopathy.    Reproductive: The prostate gland and seminal vesicles are unremarkable.   Other: No intraperitoneal free fluid.   Musculoskeletal: No worrisome lytic or sclerotic osseous abnormality. Small umbilical hernia contains only fat.   IMPRESSION: 1. 5 x 5 x 6 mm distal left ureteral stone with mild left-sided hydroureteronephrosis and decreased perfusion to the left kidney. 2. 5 mm nonobstructing stone lower pole right kidney. 3. Hepatic steatosis. 4. Small hiatal hernia. 5. Left colonic diverticulosis without diverticulitis. 6.  Aortic Atherosclerosis (ICD10-I70.0).  Assessment and Plan: This is a 63 y.o. male with an incarcerated umbilical hernia.  --Discussed with the patient the findings on exam today which shows an incarcerated umbilical hernia.  Discussed with him the categories ranging from reducible to incarcerated to strangulated.  He is symptomatic and the hernia is incarcerated to a degree, and as such, the recommendation would be for repair.  He's in agreement.  Discussed with him the possible options for open vs robotic repair, and he has opted for robotic approach. --Discussed with him then the plan for a robotic assisted umbilical hernia repair and reviewed the surgery at length with him including the risks of bleeding, infection, injury to surrounding structures, the use of mesh, that this would be an outpatient surgery, post-operative activity restrictions, pain control, and he's willing to proceed. --Will schedule the patient for surgery on 09/27/24.  All of his questions have been answered.  I spent 60 minutes dedicated to the care of this patient on the date of this encounter to include pre-visit review of records, face-to-face time with the patient discussing diagnosis and management, and any post-visit coordination of care.   Aloysius Sheree Plant, MD [facility]           [1] No Known Allergies

## 2024-09-03 NOTE — H&P (View-Only) (Signed)
 09/03/2024  Reason for Visit: Incarcerated umbilical hernia  History of Present Illness: Gary Matthews is a 63 y.o. male presenting for evaluation of an umbilical hernia.  The patient reports noticing this about a year ago while he was doing strenuous exercise.  Separately, he had a CT scan of the abdomen pelvis for left kidney stone on 10/29/2023 which confirms him having a 2 cm umbilical hernia.  The patient reports that the area becomes more tender when he is doing strenuous activity but does not feel that the hernia has been getting bigger in size.  He reports he gets little bit flatter when he lies down but otherwise is bulging.  Denies any pain as a day goes on and feels that the discomfort is more when he is doing strenuous activities.  Patient denies any fevers, chills, chest pain, shortness of breath, nausea, vomiting, constipation.  Given his symptoms, he would like to have this addressed before he gets worse.  Past Medical History: Past Medical History:  Diagnosis Date   Arthritis 08/2016   left knee   GERD (gastroesophageal reflux disease)    History of kidney stones    Hypothyroidism 11/16/2023   2/2 Thyroidectomy     Past Surgical History: Past Surgical History:  Procedure Laterality Date   THYROIDECTOMY N/A 05/09/2017   Procedure: THYROIDECTOMY (COMPLETION OF);  Surgeon: Juengel, Paul, MD;  Location: [facility] ORS;  Service: ENT;  Laterality: N/A;   THYROIDECTOMY, PARTIAL Left 1995    Home Medications: Prior to Admission medications  Medication Sig Start Date End Date Taking? Authorizing Provider  calcium -vitamin D  (OSCAL WITH D) 500-200 MG-UNIT tablet Take 1 tablet by mouth daily.     [provider]  levothyroxine (SYNTHROID) 112 MCG tablet Take 112 mcg by mouth daily. 09/26/23   [provider]  omeprazole (PRILOSEC) 20 MG capsule Take 20 mg by mouth daily.    [provider]    Allergies: Allergies[1]  Social History:  reports that he quit  smoking about 42 years ago. His smoking use included cigarettes. He has never used smokeless tobacco. He reports current alcohol use of about 4.0 standard drinks of alcohol per week. He reports that he does not use drugs.   Family History: History reviewed. No pertinent family history.  Review of Systems: Review of Systems  Constitutional:  Negative for chills and fever.  HENT:  Negative for hearing loss.   Respiratory:  Negative for shortness of breath.   Cardiovascular:  Negative for chest pain.  Gastrointestinal:  Positive for abdominal pain. Negative for nausea and vomiting.  Genitourinary:  Negative for dysuria.  Musculoskeletal:  Negative for myalgias.  Skin:  Negative for rash.  Neurological:  Negative for dizziness.  Psychiatric/Behavioral:  Negative for depression.     Physical Exam BP (!) 155/81   Pulse 80   Ht 5' 10 (1.778 m)   Wt 214 lb (97.1 kg)   SpO2 98%   BMI 30.71 kg/m  CONSTITUTIONAL: No acute distress, well-nourished HEENT:  Normocephalic, atraumatic, extraocular motion intact. NECK: Trachea is midline, and there is no jugular venous distension.  RESPIRATORY:  Lungs are clear, and breath sounds are equal bilaterally. Normal respiratory effort without pathologic use of accessory muscles. CARDIOVASCULAR: Heart is regular without murmurs, gallops, or rubs. GI: The abdomen is soft, nondistended, with mild tenderness to palpation at the umbilicus.  The patient has a small umbilical hernia which is not reducible with some tenderness when I was trying to push on it to try  to reduce.  However no evidence of strangulation.   MUSCULOSKELETAL:  Normal muscle strength and tone in all four extremities.  No peripheral edema or cyanosis. SKIN: Skin turgor is normal. There are no pathologic skin lesions.  NEUROLOGIC:  Motor and sensation is grossly normal.  Cranial nerves are grossly intact. PSYCH:  Alert and oriented to person, place and time. Affect is normal.  Laboratory  Analysis: Labs from 10/29/2023: Sodium 138, potassium 3.7, chloride 104, CO2 27, BUN 15, creatinine 1.05.  WBC 9.1, hemoglobin 15.1, hematocrit 43.5, platelets 219.  Imaging: CT abdomen/pelvis on 10/29/2023: FINDINGS: Lower chest: Dependent atelectasis.   Hepatobiliary: The liver shows diffusely decreased attenuation suggesting fat deposition. 19 mm water density lesion in the medial right liver (31/2) is compatible with a cyst. No followup imaging is recommended. There is no evidence for gallstones, gallbladder wall thickening, or pericholecystic fluid. No intrahepatic or extrahepatic biliary dilation.   Pancreas: No focal mass lesion. No dilatation of the main duct. No intraparenchymal cyst. No peripancreatic edema.   Spleen: No splenomegaly. No suspicious focal mass lesion.   Adrenals/Urinary Tract: No adrenal nodule or mass. 5 mm nonobstructing stone identified lower pole right kidney. Right ureter unremarkable. No stones are seen in the left kidney although there is mild left-sided hydroureteronephrosis and decreased perfusion to the left kidney. Left ureter is dilated down to the pelvis where a 5 x 5 x 6 mm distal left ureteral stone is identified just proximal to the UVJ (axial 84/2 and sagittal 60/6).   Stomach/Bowel: Small hiatal hernia. Stomach is unremarkable. No gastric wall thickening. No evidence of outlet obstruction. Duodenum is normally positioned as is the ligament of Treitz. No small bowel wall thickening. No small bowel dilatation. The terminal  ileum is normal. The appendix is normal. No gross colonic mass. No colonic wall thickening. Diverticular changes are noted in the left colon without evidence of diverticulitis.   Vascular/Lymphatic: There is mild atherosclerotic calcification of the abdominal aorta without aneurysm. There is no gastrohepatic or hepatoduodenal ligament lymphadenopathy. No retroperitoneal or mesenteric lymphadenopathy. No pelvic sidewall lymphadenopathy.    Reproductive: The prostate gland and seminal vesicles are unremarkable.   Other: No intraperitoneal free fluid.   Musculoskeletal: No worrisome lytic or sclerotic osseous abnormality. Small umbilical hernia contains only fat.   IMPRESSION: 1. 5 x 5 x 6 mm distal left ureteral stone with mild left-sided hydroureteronephrosis and decreased perfusion to the left kidney. 2. 5 mm nonobstructing stone lower pole right kidney. 3. Hepatic steatosis. 4. Small hiatal hernia. 5. Left colonic diverticulosis without diverticulitis. 6.  Aortic Atherosclerosis (ICD10-I70.0).  Assessment and Plan: This is a 63 y.o. male with an incarcerated umbilical hernia.  --Discussed with the patient the findings on exam today which shows an incarcerated umbilical hernia.  Discussed with him the categories ranging from reducible to incarcerated to strangulated.  He is symptomatic and the hernia is incarcerated to a degree, and as such, the recommendation would be for repair.  He's in agreement.  Discussed with him the possible options for open vs robotic repair, and he has opted for robotic approach. --Discussed with him then the plan for a robotic assisted umbilical hernia repair and reviewed the surgery at length with him including the risks of bleeding, infection, injury to surrounding structures, the use of mesh, that this would be an outpatient surgery, post-operative activity restrictions, pain control, and he's willing to proceed. --Will schedule the patient for surgery on 09/27/24.  All of his questions have been answered.  I spent 60 minutes dedicated to the care of this patient on the date of this encounter to include pre-visit review of records, face-to-face time with the patient discussing diagnosis and management, and any post-visit coordination of care.   Aloysius Sheree Plant, MD [facility]           [1] No Known Allergies

## 2024-09-03 NOTE — Patient Instructions (Signed)
 You have requested for your Umbilical Hernia be repaired. This will be scheduled with Dr. Aleen Campi at [facility].   If you are on any injectable weight loss medication, you will need to stop taking your GLP-1 injectable (weight loss) medications 8 days before your surgery to avoid any complications with anesthesia.   Please see your (blue)pre-care sheet for information. Our surgery scheduler will call you to verify surgery date and to go over information.   You will need to arrange to be off work for 1-2 weeks but will have to have a lifting restriction of no more than 15 lbs for 6 weeks following your surgery. If you have FMLA or disability paperwork that needs filled out you may drop this off at our office or this can be faxed to [phone].     Umbilical Hernia, Adult A hernia is a bulge of tissue that pushes through an opening between muscles. An umbilical hernia happens in the abdomen, near the belly button (umbilicus). The hernia may contain tissues from the small intestine, large intestine, or fatty tissue covering the intestines (omentum). Umbilical hernias in adults tend to get worse over time, and they require surgical treatment. There are several types of umbilical hernias. You may have: A hernia located just above or below the umbilicus (indirect hernia). This is the most common type of umbilical hernia in adults. A hernia that forms through an opening formed by the umbilicus (direct hernia). A hernia that comes and goes (reducible hernia). A reducible hernia may be visible only when you strain, lift something heavy, or cough. This type of hernia can be pushed back into the abdomen (reduced). A hernia that traps abdominal tissue inside the hernia (incarcerated hernia). This type of hernia cannot be reduced. A hernia that cuts off blood flow to the tissues inside the hernia (strangulated hernia). The tissues can start to die if this happens. This type of hernia  requires emergency treatment.  What are the causes? An umbilical hernia happens when tissue inside the abdomen presses on a weak area of the abdominal muscles. What increases the risk? You may have a greater risk of this condition if you: Are obese. Have had several pregnancies. Have a buildup of fluid inside your abdomen (ascites). Have had surgery that weakens the abdominal muscles.  What are the signs or symptoms? The main symptom of this condition is a painless bulge at or near the belly button. A reducible hernia may be visible only when you strain, lift something heavy, or cough. Other symptoms may include: Dull pain. A feeling of pressure.  Symptoms of a strangulated hernia may include: Pain that gets increasingly worse. Nausea and vomiting. Pain when pressing on the hernia. Skin over the hernia becoming red or purple. Constipation. Blood in the stool.  How is this diagnosed? This condition may be diagnosed based on: A physical exam. You may be asked to cough or strain while standing. These actions increase the pressure inside your abdomen and force the hernia through the opening in your muscles. Your health care provider may try to reduce the hernia by pressing on it. Your symptoms and medical history.  How is this treated? Surgery is the only treatment for an umbilical hernia. Surgery for a strangulated hernia is done as soon as possible. If you have a small hernia that is not incarcerated, you may need to lose weight before having surgery. Follow these instructions at home: Lose weight, if told by your health care provider.  Do not try to push the hernia back in. Watch your hernia for any changes in color or size. Tell your health care provider if any changes occur. You may need to avoid activities that increase pressure on your hernia. Do not lift anything that is heavier than 10 lb (4.5 kg) until your health care provider says that this is safe. Take over-the-counter  and prescription medicines only as told by your health care provider. Keep all follow-up visits as told by your health care provider. This is important. Contact a health care provider if: Your hernia gets larger. Your hernia becomes painful. Get help right away if: You develop sudden, severe pain near the area of your hernia. You have pain as well as nausea or vomiting. You have pain and the skin over your hernia changes color. You develop a fever. This information is not intended to replace advice given to you by your health care provider. Make sure you discuss any questions you have with your health care provider. Document Released: 02/06/2016 Document Revised: 05/09/2016 Document Reviewed: 02/06/2016 Elsevier Interactive Patient Education  [employer].

## 2024-09-03 NOTE — Telephone Encounter (Signed)
 Patient has been advised of Pre-Admission date/time, and Surgery date at [facility].  Surgery Date: 09/27/24 Preadmission Testing Date: Preadmissions to call patient with appointment  Patient informed of the scheduling process and surgery information given at time of office visit.   Patient has been made aware to call [phone], between 1-3:00pm the day before surgery, to find out what time to arrive for surgery.

## 2024-09-12 ENCOUNTER — Other Ambulatory Visit: Payer: Self-pay

## 2024-09-12 ENCOUNTER — Encounter
Admission: RE | Admit: 2024-09-12 | Discharge: 2024-09-12 | Disposition: A | Discharge status: Home / Self Care | Source: Ambulatory Visit | Attending: Surgery | Admitting: Surgery

## 2024-09-12 HISTORY — DX: Diverticulosis of intestine, part unspecified, without perforation or abscess without bleeding: K57.90

## 2024-09-12 HISTORY — DX: Personal history of other diseases of the digestive system: Z87.19

## 2024-09-12 HISTORY — DX: Umbilical hernia with obstruction, without gangrene: K42.0

## 2024-09-12 HISTORY — DX: Atherosclerosis of aorta: I70.0

## 2024-09-12 NOTE — Patient Instructions (Addendum)
 Your procedure is scheduled on: 09/27/24 - Thursday Report to the Registration Desk on the 1st floor of the [facility]. To find out your arrival time, please call [phone] between 1PM - 3PM on: 09/26/24 - Wednesday If your arrival time is 6:00 am, do not arrive before that time as the [facility] entrance doors do not open until 6:00 am.  REMEMBER: Instructions that are not followed completely may result in serious medical risk, up to and including death; or upon the discretion of your surgeon and anesthesiologist your surgery may need to be rescheduled.  Do not eat food after midnight the night before surgery.  No gum chewing or hard candies.  You may however, drink CLEAR liquids up to 2 hours before you are scheduled to arrive for your surgery. Do not drink anything within 2 hours of your scheduled arrival time.  Clear liquids include: - water  - apple juice without pulp - gatorade (not RED colors) - black coffee or tea (Do NOT add milk or creamers to the coffee or tea) Do NOT drink anything that is not on this list.  One week prior to surgery: Stop Anti-inflammatories (NSAIDS) such as Advil, Aleve, Ibuprofen, Motrin, Naproxen, Naprosyn and Aspirin based products such as Excedrin, Goody's Powder, BC Powder. You may take Tylenol  if needed for pain up until the day of surgery.  Stop ANY OVER THE COUNTER supplements until after surgery.  ON THE DAY OF SURGERY ONLY TAKE THESE MEDICATIONS WITH SIPS OF WATER:  levothyroxine (SYNTHROID)  omeprazole (PRILOSEC)   No Alcohol for 24 hours before or after surgery.  No Smoking including e-cigarettes for 24 hours before surgery.  No chewable tobacco products for at least 6 hours before surgery.  No nicotine patches on the day of surgery.  Do not use any recreational drugs for at least a week (preferably 2 weeks) before your surgery.  Please be advised that the combination of cocaine and anesthesia may have negative outcomes, up to  and including death. If you test positive for cocaine, your surgery will be cancelled.  On the morning of surgery brush your teeth with toothpaste and water, you may rinse your mouth with mouthwash if you wish. Do not swallow any toothpaste or mouthwash.  Use CHG Soap or wipes as directed on instruction sheet.  Do not wear jewelry, make-up, hairpins, clips or nail polish.  For welded (permanent) jewelry: bracelets, anklets, waist bands, etc.  Please have this removed prior to surgery.  If it is not removed, there is a chance that hospital personnel will need to cut it off on the day of surgery.  Do not wear lotions, powders, or perfumes.   Do not shave body hair from the neck down 48 hours before surgery.  Contact lenses, hearing aids and dentures may not be worn into surgery.  Do not bring valuables to the hospital. [facility] is not responsible for any missing/lost belongings or valuables.   Notify your doctor if there is any change in your medical condition (cold, fever, infection).  Wear comfortable clothing (specific to your surgery type) to the hospital.  After surgery, you can help prevent lung complications by doing breathing exercises.  Take deep breaths and cough every 1-2 hours. Your doctor may order a device called an Incentive Spirometer to help you take deep breaths.  When coughing or sneezing, hold a pillow firmly against your incision with both hands. This is called splinting. Doing this helps protect your incision. It also decreases  belly discomfort.  If you are being admitted to the hospital overnight, leave your suitcase in the car. After surgery it may be brought to your room.  In case of increased patient census, it may be necessary for you, the patient, to continue your postoperative care in the Same Day Surgery department.  If you are being discharged the day of surgery, you will not be allowed to drive home. You will need a responsible individual to drive  you home and stay with you for 24 hours after surgery.   If you are taking public transportation, you will need to have a responsible individual with you.  Please call the Pre-admissions Testing Dept. at [phone] if you have any questions about these instructions.  Surgery Visitation Policy:  Patients having surgery or a procedure may have two visitors.  Children under the age of 53 must have an adult with them who is not the patient.  Inpatient Visitation:    Visiting hours are 7 a.m. to 8 p.m. Up to four visitors are allowed at one time in a patient room. The visitors may rotate out with other people during the day.  One visitor age 4 or older may stay with the patient overnight and must be in the room by 8 p.m.   Merchandiser, Retail to address health-related social needs:  [email or web address]                                                                                                            Preparing for Surgery with CHLORHEXIDINE GLUCONATE (CHG) Soap  Chlorhexidine Gluconate (CHG) Soap  o An antiseptic cleaner that kills germs and bonds with the skin to continue killing germs even after washing  o Used for showering the night before surgery and morning of surgery  Before surgery, you can play an important role by reducing the number of germs on your skin.  CHG (Chlorhexidine gluconate) soap is an antiseptic cleanser which kills germs and bonds with the skin to continue killing germs even after washing.  Please do not use if you have an allergy to CHG or antibacterial soaps. If your skin becomes reddened/irritated stop using the CHG.  1. Shower the NIGHT BEFORE SURGERY with CHG soap.  2. If you choose to wash your hair, wash your hair first as usual with your normal shampoo.  3. After shampooing, rinse your hair and body thoroughly to remove the shampoo.  4. Use CHG as you would any other liquid soap. You can apply CHG directly to the  skin and wash gently with a clean washcloth.  5. Apply the CHG soap to your body only from the neck down. Do not use on open wounds or open sores. Avoid contact with your eyes, ears, mouth, and genitals (private parts). Wash face and genitals (private parts) with your normal soap.  6. Wash thoroughly, paying special attention to the area where your surgery will be performed.  7. Thoroughly rinse your body with warm water.  8. Do not shower/wash with your normal soap after  using and rinsing off the CHG soap.  9. Do not use lotions, oils, etc., after showering with CHG.  10. Pat yourself dry with a clean towel.  11. Wear clean pajamas to bed the night before surgery.  12. Place clean sheets on your bed the night of your shower and do not sleep with pets.  13. Do not apply any deodorants/lotions/powders.  14. Please wear clean clothes to the hospital.  15. Remember to brush your teeth with your regular toothpaste.

## 2024-09-27 ENCOUNTER — Encounter: Payer: Self-pay | Admitting: Surgery

## 2024-09-27 ENCOUNTER — Ambulatory Visit: Payer: Self-pay | Admitting: Urgent Care

## 2024-09-27 ENCOUNTER — Ambulatory Visit
Admission: RE | Admit: 2024-09-27 | Discharge: 2024-09-27 | Disposition: A | Discharge status: Home / Self Care | Attending: Surgery | Admitting: Surgery

## 2024-09-27 ENCOUNTER — Encounter
Admission: RE | Disposition: A | Discharge status: Home / Self Care | Payer: Self-pay | Source: Home / Self Care | Attending: Surgery

## 2024-09-27 ENCOUNTER — Other Ambulatory Visit: Payer: Self-pay

## 2024-09-27 ENCOUNTER — Ambulatory Visit: Payer: Self-pay | Admitting: Certified Registered"

## 2024-09-27 DIAGNOSIS — Z87442 Personal history of urinary calculi: Secondary | ICD-10-CM | POA: Diagnosis not present

## 2024-09-27 DIAGNOSIS — K5730 Diverticulosis of large intestine without perforation or abscess without bleeding: Secondary | ICD-10-CM | POA: Diagnosis not present

## 2024-09-27 DIAGNOSIS — K219 Gastro-esophageal reflux disease without esophagitis: Secondary | ICD-10-CM | POA: Insufficient documentation

## 2024-09-27 DIAGNOSIS — K420 Umbilical hernia with obstruction, without gangrene: Secondary | ICD-10-CM | POA: Diagnosis present

## 2024-09-27 DIAGNOSIS — Z87891 Personal history of nicotine dependence: Secondary | ICD-10-CM | POA: Diagnosis not present

## 2024-09-27 DIAGNOSIS — K449 Diaphragmatic hernia without obstruction or gangrene: Secondary | ICD-10-CM | POA: Diagnosis not present

## 2024-09-27 DIAGNOSIS — E039 Hypothyroidism, unspecified: Secondary | ICD-10-CM | POA: Diagnosis not present

## 2024-09-27 HISTORY — PX: INSERTION OF MESH: SHX5868

## 2024-09-27 SURGERY — REPAIR, HERNIA, UMBILICAL, ROBOT-ASSISTED
Anesthesia: General

## 2024-09-27 MED ORDER — ONDANSETRON HCL 4 MG/2ML IJ SOLN
INTRAMUSCULAR | Status: AC
  Filled 2024-09-27: qty 2

## 2024-09-27 MED ORDER — BUPIVACAINE LIPOSOME 1.3 % IJ SUSP
INTRAMUSCULAR | Status: AC
  Filled 2024-09-27: qty 20

## 2024-09-27 MED ORDER — ORAL CARE MOUTH RINSE: 15.0000 mL | Freq: Once | OROMUCOSAL | Status: AC

## 2024-09-27 MED ORDER — ROCURONIUM BROMIDE 100 MG/10ML IV SOLN
INTRAVENOUS | Status: DC | PRN
  Administered 2024-09-27: 20 mg via INTRAVENOUS
  Administered 2024-09-27: 70 mg via INTRAVENOUS

## 2024-09-27 MED ORDER — CHLORHEXIDINE GLUCONATE CLOTH 2 % EX PADS: 6.0000 | MEDICATED_PAD | Freq: Once | CUTANEOUS | Status: DC

## 2024-09-27 MED ORDER — ACETAMINOPHEN 500 MG PO TABS
1000.0000 mg | ORAL_TABLET | ORAL | Status: AC
  Administered 2024-09-27: 1000 mg via ORAL

## 2024-09-27 MED ORDER — OXYCODONE HCL 5 MG PO TABS
ORAL_TABLET | ORAL | Status: AC
  Filled 2024-09-27: qty 1

## 2024-09-27 MED ORDER — CELECOXIB 200 MG PO CAPS
200.0000 mg | ORAL_CAPSULE | Freq: Once | ORAL | Status: AC
  Administered 2024-09-27: 200 mg via ORAL

## 2024-09-27 MED ORDER — ACETAMINOPHEN 500 MG PO TABS
ORAL_TABLET | ORAL | Status: AC
  Filled 2024-09-27: qty 2

## 2024-09-27 MED ORDER — DROPERIDOL 2.5 MG/ML IJ SOLN
0.6250 mg | Freq: Once | INTRAMUSCULAR | Status: AC
  Administered 2024-09-27: 0.625 mg via INTRAVENOUS

## 2024-09-27 MED ORDER — LACTATED RINGERS IV SOLN: INTRAVENOUS | Status: DC

## 2024-09-27 MED ORDER — LACTATED RINGERS IV SOLN: INTRAVENOUS | Status: DC | PRN

## 2024-09-27 MED ORDER — CEFAZOLIN SODIUM-DEXTROSE 2-4 GM/100ML-% IV SOLN
INTRAVENOUS | Status: AC
  Filled 2024-09-27: qty 100

## 2024-09-27 MED ORDER — CHLORHEXIDINE GLUCONATE 0.12 % MT SOLN
OROMUCOSAL | Status: AC
  Filled 2024-09-27: qty 15

## 2024-09-27 MED ORDER — SODIUM CHLORIDE (PF) 0.9 % IJ SOLN
INTRAMUSCULAR | Status: DC | PRN
  Administered 2024-09-27: 60 mL via INTRAMUSCULAR

## 2024-09-27 MED ORDER — ACETAMINOPHEN 500 MG PO TABS: 1000.0000 mg | ORAL_TABLET | Freq: Four times a day (QID) | ORAL | Status: AC | PRN

## 2024-09-27 MED ORDER — CHLORHEXIDINE GLUCONATE 0.12 % MT SOLN
15.0000 mL | Freq: Once | OROMUCOSAL | Status: AC
  Administered 2024-09-27: 15 mL via OROMUCOSAL

## 2024-09-27 MED ORDER — GABAPENTIN 300 MG PO CAPS
ORAL_CAPSULE | ORAL | Status: AC
  Filled 2024-09-27: qty 1

## 2024-09-27 MED ORDER — OXYCODONE HCL 5 MG PO TABS
5.0000 mg | ORAL_TABLET | Freq: Once | ORAL | Status: AC | PRN
  Administered 2024-09-27: 5 mg via ORAL

## 2024-09-27 MED ORDER — SODIUM CHLORIDE (PF) 0.9 % IJ SOLN
INTRAMUSCULAR | Status: AC
  Filled 2024-09-27: qty 10

## 2024-09-27 MED ORDER — OXYCODONE HCL 5 MG PO TABS: 5.0000 mg | ORAL_TABLET | ORAL | 0 refills | Status: AC | PRN

## 2024-09-27 MED ORDER — HYDROMORPHONE HCL 1 MG/ML IJ SOLN
INTRAMUSCULAR | Status: DC | PRN
  Administered 2024-09-27 (×2): .5 mg via INTRAVENOUS

## 2024-09-27 MED ORDER — DROPERIDOL 2.5 MG/ML IJ SOLN
INTRAMUSCULAR | Status: AC
  Filled 2024-09-27: qty 2

## 2024-09-27 MED ORDER — BUPIVACAINE LIPOSOME 1.3 % IJ SUSP: 20.0000 mL | Freq: Once | INTRAMUSCULAR | Status: DC

## 2024-09-27 MED ORDER — PHENYLEPHRINE HCL-NACL 20-0.9 MG/250ML-% IV SOLN
INTRAVENOUS | Status: AC
  Filled 2024-09-27: qty 250

## 2024-09-27 MED ORDER — PROPOFOL 10 MG/ML IV BOLUS
INTRAVENOUS | Status: DC | PRN
  Administered 2024-09-27: 200 mg via INTRAVENOUS

## 2024-09-27 MED ORDER — FENTANYL CITRATE (PF) 100 MCG/2ML IJ SOLN
INTRAMUSCULAR | Status: DC | PRN
  Administered 2024-09-27: 100 ug via INTRAVENOUS

## 2024-09-27 MED ORDER — OXYCODONE HCL 5 MG/5ML PO SOLN: 5.0000 mg | Freq: Once | ORAL | Status: AC | PRN

## 2024-09-27 MED ORDER — HYDROMORPHONE HCL 1 MG/ML IJ SOLN
INTRAMUSCULAR | Status: AC
  Filled 2024-09-27: qty 1

## 2024-09-27 MED ORDER — BUPIVACAINE-EPINEPHRINE (PF) 0.5% -1:200000 IJ SOLN
INTRAMUSCULAR | Status: AC
  Filled 2024-09-27: qty 30

## 2024-09-27 MED ORDER — ONDANSETRON HCL 4 MG/2ML IJ SOLN
INTRAMUSCULAR | Status: DC | PRN
  Administered 2024-09-27: 4 mg via INTRAVENOUS

## 2024-09-27 MED ORDER — KETOROLAC TROMETHAMINE 30 MG/ML IJ SOLN
INTRAMUSCULAR | Status: DC | PRN
  Administered 2024-09-27: 30 mg via INTRAVENOUS

## 2024-09-27 MED ORDER — LIDOCAINE HCL (CARDIAC) PF 100 MG/5ML IV SOSY
PREFILLED_SYRINGE | INTRAVENOUS | Status: DC | PRN
  Administered 2024-09-27: 100 mg via INTRAVENOUS

## 2024-09-27 MED ORDER — SUGAMMADEX SODIUM 200 MG/2ML IV SOLN
INTRAVENOUS | Status: DC | PRN
  Administered 2024-09-27: 300 mg via INTRAVENOUS

## 2024-09-27 MED ORDER — FENTANYL CITRATE (PF) 100 MCG/2ML IJ SOLN
25.0000 ug | INTRAMUSCULAR | Status: DC | PRN
  Administered 2024-09-27: 25 ug via INTRAVENOUS

## 2024-09-27 MED ORDER — IBUPROFEN 600 MG PO TABS: 600.0000 mg | ORAL_TABLET | Freq: Three times a day (TID) | ORAL | 1 refills | Status: AC | PRN

## 2024-09-27 MED ORDER — ONDANSETRON HCL 4 MG/5ML PO SOLN
4.0000 mg | Freq: Once | ORAL | Status: AC
  Administered 2024-09-27: 4 mg via ORAL
  Filled 2024-09-27: qty 5

## 2024-09-27 MED ORDER — CEFAZOLIN SODIUM-DEXTROSE 2-4 GM/100ML-% IV SOLN
2.0000 g | INTRAVENOUS | Status: AC
  Administered 2024-09-27: 2 g via INTRAVENOUS

## 2024-09-27 MED ORDER — DEXAMETHASONE SOD PHOSPHATE PF 10 MG/ML IJ SOLN
INTRAMUSCULAR | Status: DC | PRN
  Administered 2024-09-27: 10 mg via INTRAVENOUS

## 2024-09-27 MED ORDER — FENTANYL CITRATE (PF) 100 MCG/2ML IJ SOLN
INTRAMUSCULAR | Status: AC
  Filled 2024-09-27: qty 2

## 2024-09-27 MED ORDER — MIDAZOLAM HCL 2 MG/2ML IJ SOLN
INTRAMUSCULAR | Status: AC
  Filled 2024-09-27: qty 2

## 2024-09-27 MED ORDER — KETAMINE HCL 10 MG/ML IJ SOLN
INTRAMUSCULAR | Status: DC | PRN
  Administered 2024-09-27: 30 mg via INTRAVENOUS

## 2024-09-27 MED ORDER — CHLORHEXIDINE GLUCONATE CLOTH 2 % EX PADS
6.0000 | MEDICATED_PAD | Freq: Once | CUTANEOUS | Status: AC
  Administered 2024-09-27: 6 via TOPICAL

## 2024-09-27 MED ORDER — GABAPENTIN 300 MG PO CAPS
300.0000 mg | ORAL_CAPSULE | ORAL | Status: AC
  Administered 2024-09-27: 300 mg via ORAL

## 2024-09-27 MED ORDER — CELECOXIB 200 MG PO CAPS
ORAL_CAPSULE | ORAL | Status: AC
  Filled 2024-09-27: qty 1

## 2024-09-27 MED ORDER — PROPOFOL 10 MG/ML IV BOLUS
INTRAVENOUS | Status: AC
  Filled 2024-09-27: qty 20

## 2024-09-27 MED ORDER — MIDAZOLAM HCL (PF) 2 MG/2ML IJ SOLN
INTRAMUSCULAR | Status: DC | PRN
  Administered 2024-09-27: 2 mg via INTRAVENOUS

## 2024-09-27 MED ORDER — KETAMINE HCL 50 MG/5ML IJ SOSY
PREFILLED_SYRINGE | INTRAMUSCULAR | Status: AC
  Filled 2024-09-27: qty 5

## 2024-09-27 SURGICAL SUPPLY — 40 items
CANNULA CAP OBTURATR AIRSEAL 8 (CAP) IMPLANT
COVER TIP SHEARS 8 DVNC (MISCELLANEOUS) ×2 IMPLANT
COVER WAND RF STERILE (DRAPES) ×2 IMPLANT
DEFOGGER SCOPE WARM SEASHARP (MISCELLANEOUS) ×2 IMPLANT
DERMABOND ADVANCED .7 DNX12 (GAUZE/BANDAGES/DRESSINGS) ×2 IMPLANT
DRAPE ARM DVNC X/XI (DISPOSABLE) ×6 IMPLANT
DRAPE COLUMN DVNC XI (DISPOSABLE) ×2 IMPLANT
ELECTRODE CAUTERY BLDE TIP 2.5 (TIP) ×2 IMPLANT
ELECTRODE REM PT RTRN 9FT ADLT (ELECTROSURGICAL) ×2 IMPLANT
FORCEPS BPLR R/ABLATION 8 DVNC (INSTRUMENTS) ×2 IMPLANT
GLOVE SURG SYN 7.0 PF PI (GLOVE) ×4 IMPLANT
GLOVE SURG SYN 7.5 PF PI (GLOVE) ×4 IMPLANT
GOWN STRL REUS W/ TWL LRG LVL3 (GOWN DISPOSABLE) ×6 IMPLANT
GRASPER SUT TROCAR 14GX15 (MISCELLANEOUS) ×2 IMPLANT
IRRIGATION STRYKERFLOW (MISCELLANEOUS) IMPLANT
IV 0.9% NACL 1000 ML (IV SOLUTION) IMPLANT
KIT PINK PAD W/HEAD ARM REST (MISCELLANEOUS) ×2 IMPLANT
LABEL OR SOLS (LABEL) ×2 IMPLANT
MANIFOLD NEPTUNE II (INSTRUMENTS) ×2 IMPLANT
MESH VENTRALIGHT ST 4.5 ECHO (Mesh General) IMPLANT
NEEDLE DRIVE SUT CUT DVNC (INSTRUMENTS) ×2 IMPLANT
NEEDLE HYPO 22X1.5 SAFETY MO (MISCELLANEOUS) ×2 IMPLANT
NEEDLE INSUFFLATION 14GA 120MM (NEEDLE) ×2 IMPLANT
OBTURATOR OPTICALSTD 8 DVNC (TROCAR) ×2 IMPLANT
PACK LAP CHOLECYSTECTOMY (MISCELLANEOUS) ×2 IMPLANT
SCISSORS MNPLR CVD DVNC XI (INSTRUMENTS) ×2 IMPLANT
SEAL UNIV 5-12 XI (MISCELLANEOUS) ×4 IMPLANT
SET TUBE FILTERED XL AIRSEAL (SET/KITS/TRAYS/PACK) IMPLANT
SET TUBE SMOKE EVAC HIGH FLOW (TUBING) ×2 IMPLANT
SOLN STERILE WATER 500 ML (IV SOLUTION) ×2 IMPLANT
SOLUTION ELECTROSURG ANTI STCK (MISCELLANEOUS) ×2 IMPLANT
SPONGE T-LAP 18X18 ~~LOC~~+RFID (SPONGE) IMPLANT
SUT STRATA 2-0 30 CT-2 (SUTURE) ×4 IMPLANT
SUT STRATAFIX PDS 30 CT-1 (SUTURE) ×2 IMPLANT
SUT VIC AB 2-0 SH 27XBRD (SUTURE) ×2 IMPLANT
SUT VIC AB 3-0 SH 27X BRD (SUTURE) ×2 IMPLANT
SUT VICRYL 0 UR6 27IN ABS (SUTURE) ×4 IMPLANT
SUTURE MNCRL 4-0 27XMF (SUTURE) ×2 IMPLANT
SYSTEM BAG RETRIEVAL 10MM (BASKET) IMPLANT
TRAY FOLEY SLVR 16FR LF STAT (SET/KITS/TRAYS/PACK) ×2 IMPLANT

## 2024-09-27 NOTE — Transfer of Care (Signed)
 Immediate Anesthesia Transfer of Care Note  Patient: [name]  Procedure(s) Performed: REPAIR, HERNIA, UMBILICAL, ROBOT-ASSISTED INSERTION OF MESH  Patient Location: PACU  Anesthesia Type:General  Level of Consciousness: drowsy, patient cooperative, and responds to stimulation  Airway & Oxygen Therapy: Patient Spontanous Breathing and Patient connected to face mask oxygen  Post-op Assessment: Report given to RN, Post -op Vital signs reviewed and stable, and Patient moving all extremities X 4  Post vital signs: Reviewed and stable  Last Vitals:  Vitals Value Taken Time  BP 168/85 09/27/24 11:35  Temp    Pulse 94 09/27/24 11:42  Resp 18 09/27/24 11:42  SpO2 100 % 09/27/24 11:42  Vitals shown include unfiled device data.  Last Pain:  Vitals:   09/27/24 0740  TempSrc: Temporal  PainSc: 0-No pain         Complications: No notable events documented.

## 2024-09-27 NOTE — Anesthesia Postprocedure Evaluation (Signed)
"   Anesthesia Post Note  Patient: Gary Matthews  Procedure(s) Performed: REPAIR, HERNIA, UMBILICAL, ROBOT-ASSISTED INSERTION OF MESH  Patient location during evaluation: PACU Anesthesia Type: General Level of consciousness: awake and alert Pain management: pain level controlled Vital Signs Assessment: post-procedure vital signs reviewed and stable Respiratory status: spontaneous breathing, nonlabored ventilation, respiratory function stable and patient connected to nasal cannula oxygen Cardiovascular status: blood pressure returned to baseline and stable Postop Assessment: no apparent nausea or vomiting Anesthetic complications: no   No notable events documented.   Last Vitals:  Vitals:   09/27/24 1246 09/27/24 1312  BP: (!) 141/74 139/79  Pulse: 79 83  Resp: 13 16  Temp: 36.9 C   SpO2: 94% 92%    Last Pain:  Vitals:   09/27/24 1246  TempSrc: Temporal  PainSc: 0-No pain                 [name]      "

## 2024-09-27 NOTE — Discharge Instructions (Signed)
 Discharge Instructions: 1.  Patient may shower, but do not scrub wounds heavily and dab dry only. 2.  Do not submerge wounds in pool/tub until fully healed. 3.  Do not apply ointments or hydrogen peroxide to the wounds. 4.  May apply ice packs to the wounds for comfort. 5.  Do not drive while taking narcotics for pain control.  Prior to driving, make sure you are able to rotate right and left to look at blindspots without significant pain or discomfort. 6.  No heavy lifting or pushing of more than 10-15 lbs for 4 weeks.  After that, can start slowly ramping up activity level.  At 6 weeks after surgery, can do all activities without restrictions.

## 2024-09-27 NOTE — Anesthesia Procedure Notes (Addendum)
 Procedure Name: Intubation Date/Time: 09/27/2024 9:41 AM  Performed by: Veronica Alm BROCKS, CRNAPre-anesthesia Checklist: Patient identified Patient Re-evaluated:Patient Re-evaluated prior to induction Oxygen Delivery Method: Circle system utilized Preoxygenation: Pre-oxygenation with 100% oxygen Induction Type: IV induction Ventilation: Mask ventilation without difficulty, Two handed mask ventilation required and Oral airway inserted - appropriate to patient size Laryngoscope Size: McGrath and 4 Grade View: Grade I Tube type: Oral Tube size: 7.0 mm Number of attempts: 2 Airway Equipment and Method: Stylet Placement Confirmation: ETT inserted through vocal cords under direct vision, positive ETCO2 and breath sounds checked- equal and bilateral Secured at: 22 cm Tube secured with: Tape Dental Injury: Teeth and Oropharynx as per pre-operative assessment  Comments: 1st attempt with Cleotilde 2 unsuccessful, second attempt successful with McGrath 4

## 2024-09-27 NOTE — Anesthesia Preprocedure Evaluation (Signed)
"                                    Anesthesia Evaluation  Patient identified by MRN, date of birth, ID band Patient awake    Reviewed: Allergy & Precautions, NPO status , Patient's Chart, lab work & pertinent test results  History of Anesthesia Complications Negative for: history of anesthetic complications  Airway Mallampati: III  TM Distance: >3 FB Neck ROM: full    Dental no notable dental hx.    Pulmonary neg pulmonary ROS, former smoker   Pulmonary exam normal        Cardiovascular negative cardio ROS Normal cardiovascular exam     Neuro/Psych negative neurological ROS  negative psych ROS   GI/Hepatic Neg liver ROS, hiatal hernia,GERD  ,,  Endo/Other  Hypothyroidism    Renal/GU      Musculoskeletal   Abdominal   Peds  Hematology negative hematology ROS (+)   Anesthesia Other Findings Past Medical History: No date: Aortic atherosclerosis 08/2016: Arthritis     Comment:  left knee No date: Diverticulosis     Comment:  Left colonic diverticulosis without diverticulitis. No date: GERD (gastroesophageal reflux disease) No date: History of hiatal hernia No date: History of kidney stones 11/16/2023: Hypothyroidism     Comment:  2/2 Thyroidectomy No date: Incarcerated umbilical hernia  Past Surgical History: No date: COLONOSCOPY W/ POLYPECTOMY No date: EXTRACORPOREAL SHOCK WAVE LITHOTRIPSY 05/09/2017: THYROIDECTOMY; N/A     Comment:  Procedure: THYROIDECTOMY (COMPLETION OF);  Surgeon:               Juengel, Paul, MD;  Location: [facility] ORS;  Service: ENT;                Laterality: N/A; 1995: THYROIDECTOMY, PARTIAL; Left  BMI    Body Mass Index: 30.85 kg/m      Reproductive/Obstetrics negative OB ROS                              Anesthesia Physical Anesthesia Plan  ASA: 2  Anesthesia Plan: General ETT   Post-op Pain Management: Tylenol  PO (pre-op)*, Gabapentin  PO (pre-op)*, Celebrex  PO (pre-op)*,  Dilaudid  IV and Ketamine  IV*   Induction: Intravenous  PONV Risk Score and Plan: 2 and Ondansetron , Dexamethasone , Midazolam  and Treatment may vary due to age or medical condition  Airway Management Planned: Oral ETT  Additional Equipment:   Intra-op Plan:   Post-operative Plan: Extubation in OR  Informed Consent: I have reviewed the patients History and Physical, chart, labs and discussed the procedure including the risks, benefits and alternatives for the proposed anesthesia with the patient or authorized representative who has indicated his/her understanding and acceptance.     Dental Advisory Given  Plan Discussed with: Anesthesiologist, CRNA and Surgeon  Anesthesia Plan Comments: (Patient consented for risks of anesthesia including but not limited to:  - adverse reactions to medications - damage to eyes, teeth, lips or other oral mucosa - nerve damage due to positioning  - sore throat or hoarseness - Damage to heart, brain, nerves, lungs, other parts of body or loss of life  Patient voiced understanding and assent.)         Anesthesia Quick Evaluation  "

## 2024-09-27 NOTE — Interval H&P Note (Signed)
 History and Physical Interval Note:  09/27/2024 9:14 AM  Gary Matthews  has presented today for surgery, with the diagnosis of Incarcerated umbilical hernia.  The various methods of treatment have been discussed with the patient and family. After consideration of risks, benefits and other options for treatment, the patient has consented to  Procedures: REPAIR, HERNIA, UMBILICAL, ROBOT-ASSISTED (N/A) as a surgical intervention.  The patient's history has been reviewed, patient examined, no change in status, stable for surgery.  I have reviewed the patient's chart and labs.  Questions were answered to the patient's satisfaction.     Arvin Abello

## 2024-09-27 NOTE — Op Note (Signed)
" °  Procedure Date:  09/27/2024  Pre-operative Diagnosis:  Incarcerated umbilical hernia  Post-operative Diagnosis: Incarcerated umbilical hernia, 2.5 cm.  Procedure:  Robotic assisted incarcerated umbilical Hernia Repair with mesh  Surgeon:  Aloysius Sheree Plant, MD  Anesthesia:  General endotracheal  Estimated Blood Loss:  10 ml  Specimens:  None  Complications:  None  Indications for Procedure:  This is a 64 y.o. male who presents with an incarcerated umbilical hernia.  The options of surgery versus observation were reviewed with the patient and/or family. The risks of bleeding, abscess or infection, recurrence of symptoms, potential for an open procedure, injury to surrounding structures, and chronic pain were all discussed with the patient and was willing to proceed.  Description of Procedure: The patient was correctly identified in the preoperative area and brought into the operating room.  The patient was placed supine with VTE prophylaxis in place.  Appropriate time-outs were performed.  Anesthesia was induced and the patient was intubated.  Appropriate antibiotics were infused.  The abdomen was prepped and draped in a sterile fashion. The patient's hernia defect was marked with a marking pen.  A Veress needle was introduced in the left upper quadrant and pneumoperitoneum was obtained with appropriate pressures.  Using Optiview technique, an 8 mm port was introduced in the left lateral abdominal wall without complications.  Then, a 12 mm port was introduced in the left upper quadrant and an 8 mm port in the left lower quadrant under direct visualization.  A 4.5 inch Bard Ventralight ST Echo mesh, a 0 Stratafix suture, and two 2-0 Stratafix sutures were inserted through the 12 mm port under direct visualization.  The DaVinci platform was docked, camera targeted, and instruments placed under direct visualization.  The patient's hernia was fully reduced and the peritoneum and preperitoneal fat  were dissected and resected to allow better exposure of the hernia defect and for better mesh placement.  The hernia defect was closed using the 0 stratafix suture.  A PMI was brought through the center of the hernia defect and the positioning system of the mesh was passed through.  This allowed the mesh to splay open and be centered over the repair site with good overlap.  The mesh was then sutured in place circumferentially and through the center of the mesh using the 2-0 Stratafix sutures.  All needles, preperitoneal fat, ruler, and the positioning system were then removed through the 12 mm port without complications.  The DaVinci platform was then undocked and instruments removed.    60 ml of Exparel  solution mixed with 0.5% bupivacaine  with epi was infiltrated around the mesh edges, hernia repair site, and port sites.  The 12 mm port was removed and the fascia was closed under direct visualization utilizing an Endo Close technique with 0 Vicryl suture.  The 8 mm ports were removed. The 12 mm incision was closed using 3-0 Vicryl and 4-0 Monocryl, and the other port incisions were closed with 4-0 Monocryl.  The wounds were cleaned and sealed with DermaBond.  The patient was emerged from anesthesia and extubated and brought to the recovery room for further management.  The patient tolerated the procedure well and all counts were correct at the end of the case.   Aloysius Sheree Plant, MD  "

## 2024-09-28 ENCOUNTER — Encounter: Payer: Self-pay | Admitting: Surgery

## 2024-09-30 ENCOUNTER — Ambulatory Visit: Admission: EM | Admit: 2024-09-30 | Discharge: 2024-09-30 | Disposition: A | Discharge status: Home / Self Care

## 2024-09-30 ENCOUNTER — Encounter: Payer: Self-pay | Admitting: Emergency Medicine

## 2024-09-30 DIAGNOSIS — R030 Elevated blood-pressure reading, without diagnosis of hypertension: Secondary | ICD-10-CM

## 2024-09-30 DIAGNOSIS — L7682 Other postprocedural complications of skin and subcutaneous tissue: Secondary | ICD-10-CM | POA: Diagnosis not present

## 2024-09-30 NOTE — Discharge Instructions (Signed)
 The surgical site looks good today without signs of infection.  Continue per plan of care as discussed by Surgery and be sure to arrange follow up as appropriate.  Seek care if redness, swelling, drainage, fever, nausea/vomiting develop from the site.

## 2024-09-30 NOTE — ED Provider Notes (Signed)
 " [facility]    CSN: [id number] Arrival date & time: 09/30/24  9177      History   Chief Complaint Chief Complaint  Patient presents with   Incisional Pain    HPI Gary Matthews is a 64 y.o. male.   Patient presents today with concern regarding surgical site.  Reports hernia repair on 09/27/2024, did not have much pain the day he went home where the day after but started having some burning and discomfort in the left upper site yesterday.  Reports it is a little bit better today.  Has not taken very much of the pain medication he was prescribed.  He denies fevers or nausea/vomiting.  No redness, swelling, warmth, drainage from the area.    Past Medical History:  Diagnosis Date   Aortic atherosclerosis    Arthritis 08/2016   left knee   Diverticulosis    Left colonic diverticulosis without diverticulitis.   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 Authorizing Provider  acetaminophen  (TYLENOL ) 500 MG tablet Take 2 tablets (1,000 mg total) by mouth  every 6 (six) hours as needed for mild pain (pain score 1-3). 09/27/24   Desiderio Schanz, MD  ibuprofen  (ADVIL ) 600 MG tablet Take 1 tablet (600 mg total) by mouth every 8 (eight) hours as needed for moderate pain (pain score 4-6). 09/27/24   Piscoya, Jose, MD  levothyroxine (SYNTHROID) 112 MCG tablet Take 112 mcg by mouth daily. 09/26/23   [provider]  omeprazole (PRILOSEC) 20 MG capsule Take 20 mg by mouth daily.    [provider]  oxyCODONE  (OXY IR/ROXICODONE ) 5 MG immediate release tablet Take 1 tablet (5 mg total) by mouth every 4 (four) hours as needed for severe pain (pain score 7-10). 09/27/24   Desiderio Schanz, MD    Family History History reviewed. No pertinent family history.  Social History Social History[1]   Allergies   Patient has no known allergies.   Review of Systems Review of Systems Per HPI  Physical Exam Triage Vital Signs ED Triage Vitals  Encounter Vitals Group     BP 09/30/24 0851 (!) 161/84     Girls Systolic BP Percentile --      Girls Diastolic BP Percentile --      Boys Systolic BP Percentile --      Boys Diastolic BP Percentile --      Pulse Rate 09/30/24 0851 78     Resp 09/30/24 0851 16     Temp 09/30/24  0851 98.8 F (37.1 C)     Temp Source 09/30/24 0851 Oral     SpO2 09/30/24 0851 93 %     Weight 09/30/24 0850 211 lb (95.7 kg)     Height 09/30/24 0850 5' 10 (1.778 m)     Head Circumference --      Peak Flow --      Pain Score 09/30/24 0850 4     Pain Loc --      Pain Education --      Exclude from Growth Chart --    No data found.  Updated Vital Signs BP (!) 161/84 (BP Location: Left Arm)   Pulse 78   Temp 98.8 F (37.1 C) (Oral)   Resp 16   Ht 5' 10 (1.778 m)   Wt 211 lb (95.7 kg)   SpO2 93%   BMI 30.28 kg/m   Visual Acuity Right Eye Distance:   Left Eye Distance:   Bilateral Distance:    Right Eye Near:   Left Eye Near:    Bilateral Near:     Physical Exam Vitals and nursing note reviewed.   Constitutional:      General: He is not in acute distress.    Appearance: Normal appearance. He is not toxic-appearing.  HENT:     Head: Normocephalic and atraumatic.     Mouth/Throat:     Mouth: Mucous membranes are moist.  Pulmonary:     Effort: Pulmonary effort is normal. No respiratory distress.  Skin:    General: Skin is warm and dry.     Capillary Refill: Capillary refill takes less than 2 seconds.     Coloration: Skin is not jaundiced or pale.     Findings: No rash.     Comments: Surgical site noted to left upper abdomen approximated with Dermabond; no dehiscence, swelling, redness, tenderness to touch, drainage.  See photograph below.  Neurological:     Mental Status: He is alert and oriented to person, place, and time.  Psychiatric:        Behavior: Behavior is cooperative.      UC Treatments / Results  Labs (all labs ordered are listed, but only abnormal results are displayed) Labs Reviewed - No data to display  EKG   Radiology No results found.  Procedures Procedures (including critical care time)  Medications Ordered in UC Medications - No data to display  Initial Impression / Assessment and Plan / UC Course  I have reviewed the triage vital signs and the nursing notes.  Pertinent labs & imaging results that were available during my care of the patient were reviewed by me and considered in my medical decision making (see chart for details).   Patient is a pleasant, well-appearing 64 year old male presenting today for surgical site concern.  He is mildly hypertensive in triage, otherwise vital signs are stable.  He denies any symptoms of elevated blood pressure today.  We discussed surgical site appears to be within normal limits at this time, it is clean dry and intact without signs of obvious infection.  Supportive care discussed; continue pain regimen as prescribed by surgery.  Return and ER precautions discussed.  Recommended follow-up with primary care  provider regarding elevated blood pressure in office today.  The patient was given the opportunity to ask questions.  All questions answered to their satisfaction.  The patient is in agreement to this plan.   Final Clinical Impressions(s) / UC Diagnoses   Final diagnoses:  Pain at surgical  incision  Elevated blood pressure reading in office without diagnosis of hypertension     Discharge Instructions      The surgical site looks good today without signs of infection.  Continue per plan of care as discussed by Surgery and be sure to arrange follow up as appropriate.  Seek care if redness, swelling, drainage, fever, nausea/vomiting develop from the site.    ED Prescriptions   None    PDMP not reviewed this encounter.    [1]  Social History Tobacco Use   Smoking status: Former    Current packs/day: 0.00    Types: Cigarettes    Quit date: 05/03/1982    Years since quitting: 42.4   Smokeless tobacco: Never  Vaping Use   Vaping status: Never Used  Substance Use Topics   Alcohol use: Yes    Alcohol/week: 4.0 standard drinks of alcohol    Types: 4 Cans of beer per week   Drug use: No     Chandra Harlene LABOR, NP 09/30/24 [phone]  "

## 2024-09-30 NOTE — ED Triage Notes (Signed)
 Patient states that he had hernia surgery on Thursday. Patient states that he is having problems with the incision on the upper left side. Patient reports ongoing pain at the incision site.  Patient denies any drainage.  Patient called to on call nurse last night and was recommended to come to have the area looked at. Patient currently not on any antibiotics.

## 2024-10-08 ENCOUNTER — Encounter: Payer: Self-pay | Admitting: *Deleted

## 2024-10-08 ENCOUNTER — Telehealth: Payer: Self-pay | Admitting: Surgery

## 2024-10-08 NOTE — Telephone Encounter (Signed)
 Pt said he needs a note faxed to [phone] stating that he he can go back to work tomorrow 10-09-24 . Restrictions are he can not lift anything over 15 lb. Pt # is [phone].

## 2024-10-12 ENCOUNTER — Ambulatory Visit: Admitting: Surgery

## 2024-10-12 ENCOUNTER — Telehealth: Payer: Self-pay | Admitting: *Deleted

## 2024-10-12 ENCOUNTER — Encounter: Payer: Self-pay | Admitting: Surgery

## 2024-10-12 VITALS — BP 128/78 | HR 88 | Ht 70.0 in | Wt 210.0 lb

## 2024-10-12 DIAGNOSIS — Z09 Encounter for follow-up examination after completed treatment for conditions other than malignant neoplasm: Secondary | ICD-10-CM

## 2024-10-12 DIAGNOSIS — K420 Umbilical hernia with obstruction, without gangrene: Secondary | ICD-10-CM | POA: Diagnosis not present

## 2024-10-12 NOTE — Telephone Encounter (Signed)
Faxed FMLA to [employer] at [phone] 

## 2024-10-12 NOTE — Progress Notes (Signed)
 " 10/12/2024  History of Present Illness: Gary Matthews is a 64 y.o. male status post robotic assisted umbilical hernia repair on 09/27/2024.  Patient had a hernia defect measuring 2.5 cm.  He presents today for follow-up.  He presented to urgent care on 09/30/2024 due to pain in the left upper quadrant incision.  There was no evidence of infection and he was discharged home.  The patient reports today that the pain is much improved.  Denies any other troubles.  Denies any nausea or vomiting.  Past Medical History: Past Medical History:  Diagnosis Date   Aortic atherosclerosis    Arthritis 08/2016   left knee   Diverticulosis    Left colonic diverticulosis without diverticulitis.   GERD (gastroesophageal reflux disease)    History of hiatal hernia    History of kidney stones    Hypothyroidism 11/16/2023   2/2 Thyroidectomy   Incarcerated umbilical hernia      Past Surgical History: Past Surgical History:  Procedure Laterality Date   COLONOSCOPY W/ POLYPECTOMY     EXTRACORPOREAL SHOCK WAVE LITHOTRIPSY     HERNIA REPAIR     INSERTION OF MESH  09/27/2024   Procedure: INSERTION OF MESH;  Surgeon: Desiderio Schanz, MD;  Location: [facility];  Service: General;;   THYROIDECTOMY N/A 05/09/2017   Procedure: THYROIDECTOMY (COMPLETION OF);  Surgeon: Juengel, Paul, MD;  Location: [facility] OR[facility];  Service: ENT;  Laterality: N/A;   THYROIDECTOMY, PARTIAL Left 1995    Home Medications: Prior to Admission medications  Medication Sig Start Date End Date Taking? Authorizing Provider  acetaminophen  (TYLENOL ) 500 MG tablet Take 2 tablets (1,000 mg total) by mouth every 6 (six) hours as needed for mild pain (pain score 1-3). 09/27/24  Yes Jozelynn Danielson, Schanz, MD  ibuprofen  (ADVIL ) 600 MG tablet Take 1 tablet (600 mg total) by mouth every 8 (eight) hours as needed for moderate pain (pain score 4-6). 09/27/24  Yes Azariel Banik, Schanz, MD  levothyroxine (SYNTHROID) 112 MCG tablet Take 112 mcg by mouth daily. 09/26/23  Yes [provider]  omeprazole (PRILOSEC) 20 MG capsule Take 20 mg by mouth daily.   Yes [provider]  oxyCODONE  (OXY IR/ROXICODONE ) 5 MG immediate release tablet Take 1 tablet (5 mg total) by mouth every 4 (four) hours as needed for severe pain (pain score 7-10). 09/27/24  Yes Desiderio Schanz, MD    Allergies: Allergies[1]  Review of Systems: Review of Systems  Constitutional:  Negative for chills and fever.  Respiratory:  Negative for shortness of breath.   Cardiovascular:  Negative for chest pain.  Gastrointestinal:  Positive for abdominal pain. Negative for nausea and vomiting.    Physical Exam BP 128/78   Pulse 88   Ht 5' 10 (1.778 m)   Wt 210 lb (95.3 kg)   SpO2 98%   BMI 30.13 kg/m  CONSTITUTIONAL: No acute distress HEENT:  Normocephalic, atraumatic, extraocular motion intact. RESPIRATORY:  Normal respiratory effort without pathologic use of accessory muscles. CARDIOVASCULAR: Regular rhythm and rate. GI: The abdomen is soft, nondistended, with appropriate soreness to palpation.  Left-sided incisions are healing well and are clean, dry, intact without any evidence of infection.  There is mild ecchymosis at the umbilical area but otherwise no evidence of recurrent hernia.  NEUROLOGIC:  Motor and sensation is grossly normal.  Cranial nerves are grossly intact. PSYCH:  Alert and oriented to person, place and time. Affect is normal.   Assessment and Plan: This is a 64 y.o. male status post robotic  assisted umbilical hernia repair.  - Discussed with the patient that the left upper quadrant incision is the largest of the 3 and due to this, deeper sutures are needed to close the defect to prevent hernias.  As such, there is more pinching of the muscles in that area which is why that incision is the most sore overall.  Nonetheless, he has been improving and this will continue to improve as everything heals.  No evidence of hernia recurrence. - Discussed with patient activity  restrictions. - Follow-up as needed.  I spent 20 minutes dedicated to the care of this patient on the date of this encounter to include pre-visit review of records, face-to-face time with the patient discussing diagnosis and management, and any post-visit coordination of care.   Aloysius Sheree Plant, MD [facility]         [1] No Known Allergies  "

## 2024-10-12 NOTE — Telephone Encounter (Signed)
 Faxed FMLA/Disability to [employer] at [phone]

## 2024-10-12 NOTE — Patient Instructions (Signed)
